# Patient Record
Sex: Female | Born: 1980 | Race: Black or African American | Hispanic: No | Marital: Single | Smoking: Current every day smoker
Health system: Southern US, Community
[De-identification: ages and names within clinical notes are randomized; demographics above are authoritative.]

## PROBLEM LIST (undated history)

## (undated) DIAGNOSIS — K2970 Gastritis, unspecified, without bleeding: Secondary | ICD-10-CM

## (undated) DIAGNOSIS — J45909 Unspecified asthma, uncomplicated: Secondary | ICD-10-CM

## (undated) HISTORY — PX: APPENDECTOMY: SHX54

---

## 2015-06-06 ENCOUNTER — Emergency Department (HOSPITAL_COMMUNITY)
Admission: EM | Admit: 2015-06-06 | Discharge: 2015-06-07 | Disposition: A | Discharge status: Home / Self Care | Payer: Self-pay | Attending: Emergency Medicine | Admitting: Emergency Medicine

## 2015-06-06 ENCOUNTER — Encounter (HOSPITAL_COMMUNITY): Payer: Self-pay | Admitting: Oncology

## 2015-06-06 DIAGNOSIS — F17200 Nicotine dependence, unspecified, uncomplicated: Secondary | ICD-10-CM | POA: Insufficient documentation

## 2015-06-06 DIAGNOSIS — R112 Nausea with vomiting, unspecified: Secondary | ICD-10-CM | POA: Insufficient documentation

## 2015-06-06 DIAGNOSIS — J45909 Unspecified asthma, uncomplicated: Secondary | ICD-10-CM | POA: Insufficient documentation

## 2015-06-06 DIAGNOSIS — R109 Unspecified abdominal pain: Secondary | ICD-10-CM | POA: Insufficient documentation

## 2015-06-06 DIAGNOSIS — R197 Diarrhea, unspecified: Secondary | ICD-10-CM | POA: Insufficient documentation

## 2015-06-06 HISTORY — DX: Unspecified asthma, uncomplicated: J45.909

## 2015-06-06 LAB — I-STAT BETA HCG BLOOD, ED (MC, WL, AP ONLY)

## 2015-06-06 LAB — COMPREHENSIVE METABOLIC PANEL
ALBUMIN: 4.2 g/dL (ref 3.5–5.0)
ALT: 9 U/L — ABNORMAL LOW (ref 14–54)
ANION GAP: 11 (ref 5–15)
AST: 17 U/L (ref 15–41)
Alkaline Phosphatase: 63 U/L (ref 38–126)
BILIRUBIN TOTAL: 0.7 mg/dL (ref 0.3–1.2)
BUN: 7 mg/dL (ref 6–20)
CALCIUM: 9.9 mg/dL (ref 8.9–10.3)
CO2: 22 mmol/L (ref 22–32)
Chloride: 102 mmol/L (ref 101–111)
Creatinine, Ser: 0.43 mg/dL — ABNORMAL LOW (ref 0.44–1.00)
GLUCOSE: 101 mg/dL — AB (ref 65–99)
POTASSIUM: 4.2 mmol/L (ref 3.5–5.1)
Sodium: 135 mmol/L (ref 135–145)
TOTAL PROTEIN: 7.5 g/dL (ref 6.5–8.1)

## 2015-06-06 LAB — CBC
HEMATOCRIT: 39 % (ref 36.0–46.0)
HEMOGLOBIN: 13.3 g/dL (ref 12.0–15.0)
MCH: 31.4 pg (ref 26.0–34.0)
MCHC: 34.1 g/dL (ref 30.0–36.0)
MCV: 92.2 fL (ref 78.0–100.0)
Platelets: 406 10*3/uL — ABNORMAL HIGH (ref 150–400)
RBC: 4.23 MIL/uL (ref 3.87–5.11)
RDW: 12.9 % (ref 11.5–15.5)
WBC: 12.6 10*3/uL — AB (ref 4.0–10.5)

## 2015-06-06 LAB — LIPASE, BLOOD: Lipase: 25 U/L (ref 11–51)

## 2015-06-06 NOTE — ED Notes (Signed)
Called pt name in lobby for blood draw with no response.  RN sts she might be in restroom, will try back later.

## 2015-06-06 NOTE — ED Notes (Signed)
Pt went to dinner tonight and is now experiencing severe abdominal pain, nausea/vomiting and diarrhea.  Pt rates pain 10/10 sharp in nature.

## 2016-05-09 ENCOUNTER — Emergency Department (HOSPITAL_COMMUNITY)
Admission: EM | Admit: 2016-05-09 | Discharge: 2016-05-09 | Disposition: A | Discharge status: Home / Self Care | Payer: Medicaid Other | Attending: Emergency Medicine | Admitting: Emergency Medicine

## 2016-05-09 ENCOUNTER — Emergency Department (HOSPITAL_COMMUNITY): Payer: Medicaid Other

## 2016-05-09 ENCOUNTER — Encounter (HOSPITAL_COMMUNITY): Payer: Self-pay

## 2016-05-09 DIAGNOSIS — F17200 Nicotine dependence, unspecified, uncomplicated: Secondary | ICD-10-CM | POA: Diagnosis not present

## 2016-05-09 DIAGNOSIS — R1013 Epigastric pain: Secondary | ICD-10-CM | POA: Insufficient documentation

## 2016-05-09 DIAGNOSIS — J45909 Unspecified asthma, uncomplicated: Secondary | ICD-10-CM | POA: Insufficient documentation

## 2016-05-09 LAB — COMPREHENSIVE METABOLIC PANEL
ALK PHOS: 58 U/L (ref 38–126)
ALT: 9 U/L — AB (ref 14–54)
AST: 18 U/L (ref 15–41)
Albumin: 3.8 g/dL (ref 3.5–5.0)
Anion gap: 9 (ref 5–15)
BUN: 6 mg/dL (ref 6–20)
CALCIUM: 8.7 mg/dL — AB (ref 8.9–10.3)
CO2: 22 mmol/L (ref 22–32)
CREATININE: 0.61 mg/dL (ref 0.44–1.00)
Chloride: 104 mmol/L (ref 101–111)
Glucose, Bld: 96 mg/dL (ref 65–99)
Potassium: 3.9 mmol/L (ref 3.5–5.1)
Sodium: 135 mmol/L (ref 135–145)
TOTAL PROTEIN: 6.7 g/dL (ref 6.5–8.1)
Total Bilirubin: 0.2 mg/dL — ABNORMAL LOW (ref 0.3–1.2)

## 2016-05-09 LAB — URINALYSIS, ROUTINE W REFLEX MICROSCOPIC
Bilirubin Urine: NEGATIVE
Glucose, UA: NEGATIVE mg/dL
Hgb urine dipstick: NEGATIVE
KETONES UR: 5 mg/dL — AB
LEUKOCYTES UA: NEGATIVE
Nitrite: NEGATIVE
PROTEIN: NEGATIVE mg/dL
Specific Gravity, Urine: 1.021 (ref 1.005–1.030)
pH: 5 (ref 5.0–8.0)

## 2016-05-09 LAB — CBC
HCT: 34.6 % — ABNORMAL LOW (ref 36.0–46.0)
Hemoglobin: 11.9 g/dL — ABNORMAL LOW (ref 12.0–15.0)
MCH: 31.2 pg (ref 26.0–34.0)
MCHC: 34.4 g/dL (ref 30.0–36.0)
MCV: 90.6 fL (ref 78.0–100.0)
PLATELETS: 389 10*3/uL (ref 150–400)
RBC: 3.82 MIL/uL — AB (ref 3.87–5.11)
RDW: 14 % (ref 11.5–15.5)
WBC: 11.8 10*3/uL — AB (ref 4.0–10.5)

## 2016-05-09 LAB — I-STAT BETA HCG BLOOD, ED (MC, WL, AP ONLY)

## 2016-05-09 LAB — LIPASE, BLOOD: LIPASE: 23 U/L (ref 11–51)

## 2016-05-09 MED ORDER — ONDANSETRON HCL 4 MG/2ML IJ SOLN
4.0000 mg | Freq: Once | INTRAMUSCULAR | Status: AC
  Administered 2016-05-09: 4 mg via INTRAVENOUS
  Filled 2016-05-09: qty 2

## 2016-05-09 MED ORDER — ONDANSETRON 4 MG PO TBDP: ORAL_TABLET | ORAL | 0 refills | Status: DC

## 2016-05-09 MED ORDER — DICYCLOMINE HCL 20 MG PO TABS: 20.0000 mg | ORAL_TABLET | Freq: Two times a day (BID) | ORAL | 0 refills | Status: DC

## 2016-05-09 MED ORDER — PANTOPRAZOLE SODIUM 40 MG IV SOLR
40.0000 mg | Freq: Once | INTRAVENOUS | Status: AC
  Administered 2016-05-09: 40 mg via INTRAVENOUS
  Filled 2016-05-09: qty 40

## 2016-05-09 MED ORDER — PANTOPRAZOLE SODIUM 20 MG PO TBEC: 20.0000 mg | DELAYED_RELEASE_TABLET | Freq: Every day | ORAL | 0 refills | Status: DC

## 2016-05-09 MED ORDER — HYDROCODONE-ACETAMINOPHEN 5-325 MG PO TABS: 1.0000 | ORAL_TABLET | Freq: Four times a day (QID) | ORAL | 0 refills | Status: DC | PRN

## 2016-05-09 MED ORDER — HYDROMORPHONE HCL 1 MG/ML IJ SOLN
1.0000 mg | Freq: Once | INTRAMUSCULAR | Status: AC
  Administered 2016-05-09: 1 mg via INTRAVENOUS
  Filled 2016-05-09: qty 1

## 2016-05-09 NOTE — ED Notes (Signed)
ED Provider at bedside. 

## 2016-05-09 NOTE — Discharge Instructions (Signed)
Follow-up with your doctor when he gets back home

## 2016-05-09 NOTE — ED Triage Notes (Signed)
Pt states that she has a hx of gastritis and is experiencing epigastric abdominal pain. Pt states that she has vomited clear liquid and is unable to keep anything down. A&Ox4.

## 2016-05-09 NOTE — ED Provider Notes (Signed)
[facility]-EMERGENCY DEPT Provider Note   CSN: [id number] Arrival date & time: 05/09/16  1329     History   Chief Complaint Chief Complaint  Patient presents with  . Abdominal Pain    HPI Shirley Sartoriuslicia Mckinney is a 36 y.o. female.  Patient complains of abdominal pain. Patient has a history of gastritis and usually takes Pepcid and Bentyl. She is out of her Pepcid   The history is provided by the patient. No language interpreter was used.  Abdominal Pain   This is a recurrent problem. The current episode started 12 to 24 hours ago. The problem occurs constantly. The problem has not changed since onset.The pain is associated with an unknown factor. The pain is located in the epigastric region. The pain is at a severity of 5/10. The pain is moderate. Pertinent negatives include anorexia, diarrhea, frequency, hematuria and headaches.    Past Medical History:  Diagnosis Date  . Asthma     There are no active problems to display for this patient.   Past Surgical History:  Procedure Laterality Date  . APPENDECTOMY      OB History    No data available       Home Medications    Prior to Admission medications   Medication Sig Start Date End Date Taking? Authorizing Provider  famotidine (PEPCID) 20 MG tablet Take 20 mg by mouth at bedtime. 03/07/16  Yes Historical Provider, MD  dicyclomine (BENTYL) 20 MG tablet Take 1 tablet (20 mg total) by mouth 2 (two) times daily. 05/09/16   Bethann BerkshireJoseph Claudy Abdallah, MD  HYDROcodone-acetaminophen (NORCO/VICODIN) 5-325 MG tablet Take 1 tablet by mouth every 6 (six) hours as needed for moderate pain. 05/09/16   Bethann BerkshireJoseph Honour Schwieger, MD  ondansetron (ZOFRAN ODT) 4 MG disintegrating tablet 4mg  ODT q4 hours prn nausea/vomit 05/09/16   Bethann BerkshireJoseph Harper Vandervoort, MD  pantoprazole (PROTONIX) 20 MG tablet Take 1 tablet (20 mg total) by mouth daily. 05/09/16   Bethann BerkshireJoseph Mackayla Mullins, MD    Family History History reviewed. No pertinent family history.  Social History Social History  Substance  Use Topics  . Smoking status: Current Every Day Smoker  . Smokeless tobacco: Never Used  . Alcohol use Yes     Allergies   Ibuprofen; Penicillins; Tramadol; and Asa [aspirin]   Review of Systems Review of Systems  Constitutional: Negative for appetite change and fatigue.  HENT: Negative for congestion, ear discharge and sinus pressure.   Eyes: Negative for discharge.  Respiratory: Negative for cough.   Cardiovascular: Negative for chest pain.  Gastrointestinal: Positive for abdominal pain. Negative for anorexia and diarrhea.  Genitourinary: Negative for frequency and hematuria.  Musculoskeletal: Negative for back pain.  Skin: Negative for rash.  Neurological: Negative for seizures and headaches.  Psychiatric/Behavioral: Negative for hallucinations.     Physical Exam Updated Vital Signs BP 108/75 (BP Location: Right Arm)   Pulse 69   Temp 98 F (36.7 C) (Oral)   Resp 16   Ht 5\' 2"  (1.575 m)   Wt 175 lb (79.4 kg)   SpO2 100%   BMI 32.01 kg/m   Physical Exam  Constitutional: She is oriented to person, place, and time. She appears well-developed.  HENT:  Head: Normocephalic.  Eyes: Conjunctivae and EOM are normal. No scleral icterus.  Neck: Neck supple. No thyromegaly present.  Cardiovascular: Normal rate and regular rhythm.  Exam reveals no gallop and no friction rub.   No murmur heard. Pulmonary/Chest: No stridor. She has no wheezes. She has no rales. She  exhibits no tenderness.  Abdominal: She exhibits no distension. There is tenderness. There is no rebound.  Tender epigastric moderate  Musculoskeletal: Normal range of motion. She exhibits no edema.  Lymphadenopathy:    She has no cervical adenopathy.  Neurological: She is oriented to person, place, and time. She exhibits normal muscle tone. Coordination normal.  Skin: No rash noted. No erythema.  Psychiatric: She has a normal mood and affect. Her behavior is normal.     ED Treatments / Results  Labs (all  labs ordered are listed, but only abnormal results are displayed) Labs Reviewed  COMPREHENSIVE METABOLIC PANEL - Abnormal; Notable for the following:       Result Value   Calcium 8.7 (*)    ALT 9 (*)    Total Bilirubin 0.2 (*)    All other components within normal limits  CBC - Abnormal; Notable for the following:    WBC 11.8 (*)    RBC 3.82 (*)    Hemoglobin 11.9 (*)    HCT 34.6 (*)    All other components within normal limits  URINALYSIS, ROUTINE W REFLEX MICROSCOPIC - Abnormal; Notable for the following:    Ketones, ur 5 (*)    All other components within normal limits  LIPASE, BLOOD  I-STAT BETA HCG BLOOD, ED ([facility], [facility], [facility] ONLY)    EKG  EKG Interpretation None       Radiology Dg Abd Acute W/chest  Result Date: 05/09/2016 CLINICAL DATA:  Pt c/o n/v epigastric pain x 2 days, states she saw an MD in her home state of [place] yesterday who said she had gastritis. Pt also c/o loss of appetite x 3 days. EXAM: DG ABDOMEN ACUTE W/ 1V CHEST COMPARISON:  None. FINDINGS: On chest radiography there is some indistinct hazy density at the right lung base. Heart size within normal limits. There several air- fluid levels in the right colon and possibly in central loops of nondilated small bowel. Otherwise normal bowel gas pattern. No significant abnormal calcifications are identified. Transitional L5 vertebra. IMPRESSION: 1. Faint hazy density at the right lung base could represent early or incipient pneumonia, correlate with breath sounds. 2. There several air-fluid levels in the ascending colon and possibly within right-sided nondilated loops of small bowel, but overall this appearance is not considered overtly abnormal and there is no dilated bowel. Electronically Signed   By: Gaylyn Rong M.D.   On: 05/09/2016 16:14    Procedures Procedures (including critical care time)  Medications Ordered in ED Medications  pantoprazole (PROTONIX) injection 40 mg (40 mg Intravenous Given  05/09/16 1508)  ondansetron (ZOFRAN) injection 4 mg (4 mg Intravenous Given 05/09/16 1508)  HYDROmorphone (DILAUDID) injection 1 mg (1 mg Intravenous Given 05/09/16 1508)  HYDROmorphone (DILAUDID) injection 1 mg (1 mg Intravenous Given 05/09/16 1614)     Initial Impression / Assessment and Plan / ED Course  I have reviewed the triage vital signs and the nursing notes.  Pertinent labs & imaging results that were available during my care of the patient were reviewed by me and considered in my medical decision making (see chart for details).     Labs unremarkable. Abdominal series unremarkable. Patient is sent home with Bentyl, protonic. Vicodin.   Pt to follow up with her md  Final Clinical Impressions(s) / ED Diagnoses   Final diagnoses:  Epigastric pain    New Prescriptions New Prescriptions   DICYCLOMINE (BENTYL) 20 MG TABLET    Take 1 tablet (20 mg total)  by mouth 2 (two) times daily.   HYDROCODONE-ACETAMINOPHEN (NORCO/VICODIN) 5-325 MG TABLET    Take 1 tablet by mouth every 6 (six) hours as needed for moderate pain.   ONDANSETRON (ZOFRAN ODT) 4 MG DISINTEGRATING TABLET    4mg  ODT q4 hours prn nausea/vomit   PANTOPRAZOLE (PROTONIX) 20 MG TABLET    Take 1 tablet (20 mg total) by mouth daily.     Bethann Berkshire, MD 05/09/16 [phone]

## 2016-05-09 NOTE — ED Notes (Signed)
Patient transported to X-ray 

## 2016-10-30 ENCOUNTER — Encounter (HOSPITAL_COMMUNITY): Payer: Self-pay | Admitting: Emergency Medicine

## 2016-10-30 ENCOUNTER — Emergency Department (HOSPITAL_COMMUNITY)
Admission: EM | Admit: 2016-10-30 | Discharge: 2016-10-30 | Disposition: A | Discharge status: Home / Self Care | Payer: Medicaid - Out of State | Attending: Emergency Medicine | Admitting: Emergency Medicine

## 2016-10-30 DIAGNOSIS — R110 Nausea: Secondary | ICD-10-CM | POA: Insufficient documentation

## 2016-10-30 DIAGNOSIS — K2920 Alcoholic gastritis without bleeding: Secondary | ICD-10-CM | POA: Diagnosis not present

## 2016-10-30 DIAGNOSIS — R109 Unspecified abdominal pain: Secondary | ICD-10-CM | POA: Diagnosis present

## 2016-10-30 DIAGNOSIS — Z79899 Other long term (current) drug therapy: Secondary | ICD-10-CM | POA: Diagnosis not present

## 2016-10-30 DIAGNOSIS — F17200 Nicotine dependence, unspecified, uncomplicated: Secondary | ICD-10-CM | POA: Insufficient documentation

## 2016-10-30 LAB — COMPREHENSIVE METABOLIC PANEL
ALBUMIN: 3.8 g/dL (ref 3.5–5.0)
ALT: 9 U/L — ABNORMAL LOW (ref 14–54)
ANION GAP: 9 (ref 5–15)
AST: 21 U/L (ref 15–41)
Alkaline Phosphatase: 76 U/L (ref 38–126)
BUN: 6 mg/dL (ref 6–20)
CHLORIDE: 102 mmol/L (ref 101–111)
CO2: 25 mmol/L (ref 22–32)
Calcium: 8.8 mg/dL — ABNORMAL LOW (ref 8.9–10.3)
Creatinine, Ser: 0.71 mg/dL (ref 0.44–1.00)
GFR calc Af Amer: >60 mL/min (ref >60)
GFR calc non Af Amer: >60 mL/min (ref >60)
GLUCOSE: 101 mg/dL — AB (ref 65–99)
POTASSIUM: 3.7 mmol/L (ref 3.5–5.1)
SODIUM: 136 mmol/L (ref 135–145)
Total Bilirubin: 0.5 mg/dL (ref 0.3–1.2)
Total Protein: 6.6 g/dL (ref 6.5–8.1)

## 2016-10-30 LAB — URINALYSIS, ROUTINE W REFLEX MICROSCOPIC
BACTERIA UA: NONE SEEN
BILIRUBIN URINE: NEGATIVE
Glucose, UA: NEGATIVE mg/dL
Hgb urine dipstick: NEGATIVE
Ketones, ur: NEGATIVE mg/dL
LEUKOCYTES UA: NEGATIVE
Nitrite: NEGATIVE
Protein, ur: 30 mg/dL — AB
SPECIFIC GRAVITY, URINE: 1.028 (ref 1.005–1.030)
pH: 7 (ref 5.0–8.0)

## 2016-10-30 LAB — CBC
HEMATOCRIT: 36.9 % (ref 36.0–46.0)
Hemoglobin: 12.7 g/dL (ref 12.0–15.0)
MCH: 31.1 pg (ref 26.0–34.0)
MCHC: 34.4 g/dL (ref 30.0–36.0)
MCV: 90.2 fL (ref 78.0–100.0)
Platelets: 381 10*3/uL (ref 150–400)
RBC: 4.09 MIL/uL (ref 3.87–5.11)
RDW: 13.7 % (ref 11.5–15.5)
WBC: 16.7 10*3/uL — ABNORMAL HIGH (ref 4.0–10.5)

## 2016-10-30 LAB — I-STAT BETA HCG BLOOD, ED (MC, WL, AP ONLY)

## 2016-10-30 LAB — LIPASE, BLOOD: LIPASE: 34 U/L (ref 11–51)

## 2016-10-30 LAB — PREGNANCY, URINE: Preg Test, Ur: NEGATIVE

## 2016-10-30 MED ORDER — MORPHINE SULFATE (PF) 4 MG/ML IV SOLN
4.0000 mg | Freq: Once | INTRAVENOUS | Status: AC
  Administered 2016-10-30: 4 mg via INTRAVENOUS
  Filled 2016-10-30: qty 1

## 2016-10-30 MED ORDER — ONDANSETRON HCL 4 MG PO TABS: 4.0000 mg | ORAL_TABLET | Freq: Three times a day (TID) | ORAL | 0 refills | Status: DC | PRN

## 2016-10-30 MED ORDER — PANTOPRAZOLE SODIUM 20 MG PO TBEC: 20.0000 mg | DELAYED_RELEASE_TABLET | Freq: Every day | ORAL | 0 refills | Status: DC

## 2016-10-30 MED ORDER — DICYCLOMINE HCL 10 MG/ML IM SOLN
20.0000 mg | Freq: Once | INTRAMUSCULAR | Status: DC
  Filled 2016-10-30: qty 2

## 2016-10-30 MED ORDER — ONDANSETRON HCL 4 MG/2ML IJ SOLN
4.0000 mg | Freq: Once | INTRAMUSCULAR | Status: AC
  Administered 2016-10-30: 4 mg via INTRAVENOUS
  Filled 2016-10-30: qty 2

## 2016-10-30 MED ORDER — SODIUM CHLORIDE 0.9 % IV BOLUS (SEPSIS)
500.0000 mL | Freq: Once | INTRAVENOUS | Status: AC
  Administered 2016-10-30: 500 mL via INTRAVENOUS

## 2016-10-30 MED ORDER — GI COCKTAIL ~~LOC~~: 30.0000 mL | Freq: Two times a day (BID) | ORAL | 0 refills | Status: DC | PRN

## 2016-10-30 MED ORDER — PANTOPRAZOLE SODIUM 40 MG IV SOLR
40.0000 mg | Freq: Once | INTRAVENOUS | Status: AC
  Administered 2016-10-30: 40 mg via INTRAVENOUS
  Filled 2016-10-30: qty 40

## 2016-10-30 MED ORDER — HYDROCODONE-ACETAMINOPHEN 5-325 MG PO TABS: 1.0000 | ORAL_TABLET | ORAL | 0 refills | Status: DC | PRN

## 2016-10-30 MED ORDER — GI COCKTAIL ~~LOC~~
30.0000 mL | Freq: Once | ORAL | Status: AC
  Administered 2016-10-30: 30 mL via ORAL
  Filled 2016-10-30: qty 30

## 2016-10-30 NOTE — ED Triage Notes (Signed)
Pt c/o abdominal pain with nausea onset today. Pt has history of gastritis and reports pain is similar. Pt LMP was 2 days ago but only spotting. Pt denies vaginal discharge or painful urination.

## 2016-10-30 NOTE — ED Provider Notes (Signed)
[facility]-EMERGENCY DEPT Provider Note   CSN: [id number] Arrival date & time: 10/30/16  1730     History   Chief Complaint Chief Complaint  Patient presents with  . Abdominal Pain  . Nausea    HPI Shirley Mckinney is a 36 y.o. female.  HPI   Pt with hx asthma, gastritis, p/w central abdominal pain and nausea that began at 8am today that feels like prior gastritis.  States she drank liquor last night for the first time in 6 months, this is what typically sets off her gastritis.  Pain is improved with pushing down on her abdomen. Denies vomiting, bowel or urinary symptoms.  No menstrual period since May 29, did have spotting two days ago.  Abdominal surgical history including 3 c-sections, appendectomy.    Past Medical History:  Diagnosis Date  . Asthma     There are no active problems to display for this patient.   Past Surgical History:  Procedure Laterality Date  . APPENDECTOMY      OB History    No data available       Home Medications    Prior to Admission medications   Medication Sig Start Date End Date Taking? Authorizing Provider  Alum & Mag Hydroxide-Simeth (GI COCKTAIL) SUSP suspension Take 30 mLs by mouth 2 (two) times daily as needed for indigestion (gastritis pain). Shake well. 10/30/16   Trixie DredgeWest, Tyera Hansley, PA-C  dicyclomine (BENTYL) 20 MG tablet Take 1 tablet (20 mg total) by mouth 2 (two) times daily. 05/09/16   Bethann BerkshireZammit, Joseph, MD  famotidine (PEPCID) 20 MG tablet Take 20 mg by mouth at bedtime. 03/07/16   [provider]  ondansetron (ZOFRAN) 4 MG tablet Take 1 tablet (4 mg total) by mouth every 8 (eight) hours as needed for nausea or vomiting. 10/30/16   Trixie DredgeWest, Jordy Hewins, PA-C  pantoprazole (PROTONIX) 20 MG tablet Take 1 tablet (20 mg total) by mouth daily. 05/09/16   Bethann BerkshireZammit, Joseph, MD  pantoprazole (PROTONIX) 20 MG tablet Take 1 tablet (20 mg total) by mouth daily. 10/30/16   Trixie DredgeWest, Feleshia Zundel, PA-C    Family History No family history on file.  Social  History Social History  Substance Use Topics  . Smoking status: Current Every Day Smoker  . Smokeless tobacco: Never Used  . Alcohol use Yes     Allergies   Ibuprofen; Penicillins; Tramadol; and Asa [aspirin]   Review of Systems Review of Systems  All other systems reviewed and are negative.    Physical Exam Updated Vital Signs BP 112/77   Pulse 79   Temp 98.3 F (36.8 C) (Oral)   Resp 20   Ht 5\' 2"  (1.575 m)   Wt 78.9 kg (174 lb)   LMP 10/28/2016 Comment: spotting 2 days ago  SpO2 100%   BMI 31.83 kg/m   Physical Exam  Constitutional: She appears well-developed and well-nourished. No distress.  HENT:  Head: Normocephalic and atraumatic.  Neck: Neck supple.  Cardiovascular: Normal rate and regular rhythm.   Pulmonary/Chest: Effort normal and breath sounds normal. No respiratory distress. She has no wheezes. She has no rales.  Abdominal: Soft. She exhibits no distension and no mass. There is no tenderness. There is no rebound and no guarding.  Neurological: She is alert.  Skin: She is not diaphoretic.  Nursing note and vitals reviewed.    ED Treatments / Results  Labs (all labs ordered are listed, but only abnormal results are displayed) Labs Reviewed  COMPREHENSIVE METABOLIC PANEL - Abnormal; Notable for  the following:       Result Value   Glucose, Bld 101 (*)    Calcium 8.8 (*)    ALT 9 (*)    All other components within normal limits  CBC - Abnormal; Notable for the following:    WBC 16.7 (*)    All other components within normal limits  URINALYSIS, ROUTINE W REFLEX MICROSCOPIC - Abnormal; Notable for the following:    APPearance HAZY (*)    Protein, ur 30 (*)    Squamous Epithelial / LPF 0-5 (*)    All other components within normal limits  LIPASE, BLOOD  PREGNANCY, URINE  I-STAT BETA HCG BLOOD, ED ([facility], [facility], [facility] ONLY)    EKG  EKG Interpretation None       Radiology No results found.  Procedures Procedures (including critical care  time)  Medications Ordered in ED Medications  dicyclomine (BENTYL) injection 20 mg (20 mg Intramuscular Refused 10/30/16 1913)  pantoprazole (PROTONIX) injection 40 mg (40 mg Intravenous Given 10/30/16 1847)  gi cocktail (Maalox,Lidocaine,Donnatal) (30 mLs Oral Given 10/30/16 1844)  ondansetron (ZOFRAN) injection 4 mg (4 mg Intravenous Given 10/30/16 1846)  sodium chloride 0.9 % bolus 500 mL (0 mLs Intravenous Stopped 10/30/16 1952)  morphine 4 MG/ML injection 4 mg (4 mg Intravenous Given 10/30/16 1919)     Initial Impression / Assessment and Plan / ED Course  I have reviewed the triage vital signs and the nursing notes.  Pertinent labs & imaging results that were available during my care of the patient were reviewed by me and considered in my medical decision making (see chart for details).  Clinical Course as of Oct 31 2010  Sat Oct 30, 2016  1937 Reexamination of the abdomen remains benign.  Mild epigastric tenderness.  No guarding or rebound.  Pt reports she is feeling better.  Tolerating PO.  States she just moved from [place] at the first of this month.  Needs resources.    [EW]    Clinical Course User Index [EW] Chad, Gwendlyn Hanback, [place]    Afebrile, nontoxic patient with recurrent gastritis.  Pt d/c with symptomatic treatment - pt complained to nurse that she wanted narcotic pain medication for home but left prior to any discussion or printing of narcotic prescription.  This was then cancelled.  Labs reassuring.  Abdominal exam is benign.  Pt advised to abstain from alcohol and cigarettes.   D/C home with PCP, GI follow up, medications for gastritis.  Discussed result, findings, treatment, and follow up  with patient.  Pt given return precautions.  Pt verbalizes understanding and agrees with plan.       Final Clinical Impressions(s) / ED Diagnoses   Final diagnoses:  Acute alcoholic gastritis, presence of bleeding unspecified    New Prescriptions Discharge Medication List as of  10/30/2016  8:04 PM    START taking these medications   Details  Alum & Mag Hydroxide-Simeth (GI COCKTAIL) SUSP suspension Take 30 mLs by mouth 2 (two) times daily as needed for indigestion (gastritis pain). Shake well., Starting Sat 10/30/2016, Print    ondansetron (ZOFRAN) 4 MG tablet Take 1 tablet (4 mg total) by mouth every 8 (eight) hours as needed for nausea or vomiting., Starting Sat 10/30/2016, Print    !! pantoprazole (PROTONIX) 20 MG tablet Take 1 tablet (20 mg total) by mouth daily., Starting Sat 10/30/2016, Print     !! - Potential duplicate medications found. Please discuss with provider.       Chad,  Mickie Hillier 10/30/16 2012    Jerelyn Scott, MD 10/30/16 2056

## 2016-10-30 NOTE — Discharge Instructions (Signed)
Read the information below.  Use the prescribed medication as directed.  Please discuss all new medications with your pharmacist.  You may return to the Emergency Department at any time for worsening condition or any new symptoms that concern you.     If you develop high fevers, worsening abdominal pain, uncontrolled vomiting, or are unable to tolerate fluids by mouth, return to the ER for a recheck.    You have been given a prescription of narcotic medication for your severe pain.  Do not use this to cover up any worsening condition.  It is important that you take the other prescribed medication as recommended and follow up with your primary care provider and the referred gastroenterologist.

## 2016-10-30 NOTE — ED Notes (Signed)
Pt given sprite and graham crackers for PO challenge 

## 2016-10-30 NOTE — ED Notes (Signed)
Pt requesting prescription to address pain. EDP made aware.

## 2016-11-03 ENCOUNTER — Encounter (HOSPITAL_COMMUNITY): Payer: Self-pay | Admitting: Emergency Medicine

## 2016-11-03 ENCOUNTER — Emergency Department (HOSPITAL_COMMUNITY)
Admission: EM | Admit: 2016-11-03 | Discharge: 2016-11-03 | Disposition: A | Discharge status: Home / Self Care | Payer: Medicaid - Out of State | Attending: Emergency Medicine | Admitting: Emergency Medicine

## 2016-11-03 ENCOUNTER — Emergency Department (HOSPITAL_COMMUNITY)
Admission: EM | Admit: 2016-11-03 | Discharge: 2016-11-03 | Disposition: A | Discharge status: Home / Self Care | Payer: Medicaid Other | Attending: Emergency Medicine | Admitting: Emergency Medicine

## 2016-11-03 DIAGNOSIS — F17200 Nicotine dependence, unspecified, uncomplicated: Secondary | ICD-10-CM | POA: Diagnosis not present

## 2016-11-03 DIAGNOSIS — Z79899 Other long term (current) drug therapy: Secondary | ICD-10-CM | POA: Insufficient documentation

## 2016-11-03 DIAGNOSIS — J45909 Unspecified asthma, uncomplicated: Secondary | ICD-10-CM | POA: Insufficient documentation

## 2016-11-03 DIAGNOSIS — Z5321 Procedure and treatment not carried out due to patient leaving prior to being seen by health care provider: Secondary | ICD-10-CM | POA: Insufficient documentation

## 2016-11-03 DIAGNOSIS — R109 Unspecified abdominal pain: Secondary | ICD-10-CM | POA: Insufficient documentation

## 2016-11-03 DIAGNOSIS — J4521 Mild intermittent asthma with (acute) exacerbation: Secondary | ICD-10-CM | POA: Insufficient documentation

## 2016-11-03 DIAGNOSIS — K2900 Acute gastritis without bleeding: Secondary | ICD-10-CM | POA: Insufficient documentation

## 2016-11-03 DIAGNOSIS — R1012 Left upper quadrant pain: Secondary | ICD-10-CM | POA: Diagnosis present

## 2016-11-03 HISTORY — DX: Gastritis, unspecified, without bleeding: K29.70

## 2016-11-03 LAB — CBC
HEMATOCRIT: 35.4 % — AB (ref 36.0–46.0)
HEMOGLOBIN: 12 g/dL (ref 12.0–15.0)
MCH: 30.6 pg (ref 26.0–34.0)
MCHC: 33.9 g/dL (ref 30.0–36.0)
MCV: 90.3 fL (ref 78.0–100.0)
Platelets: 379 10*3/uL (ref 150–400)
RBC: 3.92 MIL/uL (ref 3.87–5.11)
RDW: 13.8 % (ref 11.5–15.5)
WBC: 10.8 10*3/uL — AB (ref 4.0–10.5)

## 2016-11-03 LAB — COMPREHENSIVE METABOLIC PANEL
ALBUMIN: 3.6 g/dL (ref 3.5–5.0)
ALK PHOS: 75 U/L (ref 38–126)
ALT: 8 U/L — ABNORMAL LOW (ref 14–54)
ANION GAP: 10 (ref 5–15)
AST: 15 U/L (ref 15–41)
BILIRUBIN TOTAL: 0.4 mg/dL (ref 0.3–1.2)
BUN: 10 mg/dL (ref 6–20)
CALCIUM: 8.9 mg/dL (ref 8.9–10.3)
CO2: 20 mmol/L — ABNORMAL LOW (ref 22–32)
Chloride: 105 mmol/L (ref 101–111)
Creatinine, Ser: 0.68 mg/dL (ref 0.44–1.00)
GLUCOSE: 97 mg/dL (ref 65–99)
POTASSIUM: 4.4 mmol/L (ref 3.5–5.1)
Sodium: 135 mmol/L (ref 135–145)
TOTAL PROTEIN: 6.3 g/dL — AB (ref 6.5–8.1)

## 2016-11-03 LAB — URINALYSIS, ROUTINE W REFLEX MICROSCOPIC
BILIRUBIN URINE: NEGATIVE
Glucose, UA: NEGATIVE mg/dL
Hgb urine dipstick: NEGATIVE
Ketones, ur: NEGATIVE mg/dL
LEUKOCYTES UA: NEGATIVE
NITRITE: NEGATIVE
PH: 6 (ref 5.0–8.0)
Protein, ur: NEGATIVE mg/dL
SPECIFIC GRAVITY, URINE: 1.027 (ref 1.005–1.030)

## 2016-11-03 LAB — LIPASE, BLOOD: Lipase: 47 U/L (ref 11–51)

## 2016-11-03 MED ORDER — IPRATROPIUM-ALBUTEROL 0.5-2.5 (3) MG/3ML IN SOLN
3.0000 mL | Freq: Once | RESPIRATORY_TRACT | Status: AC
  Administered 2016-11-03: 3 mL via RESPIRATORY_TRACT
  Filled 2016-11-03: qty 3

## 2016-11-03 MED ORDER — ONDANSETRON 4 MG PO TBDP
ORAL_TABLET | ORAL | Status: AC
  Filled 2016-11-03: qty 1

## 2016-11-03 MED ORDER — ACETAMINOPHEN 500 MG PO TABS
ORAL_TABLET | ORAL | Status: AC
  Administered 2016-11-03
  Filled 2016-11-03: qty 1

## 2016-11-03 MED ORDER — PANTOPRAZOLE SODIUM 40 MG PO TBEC
40.0000 mg | DELAYED_RELEASE_TABLET | Freq: Once | ORAL | Status: AC
  Administered 2016-11-03: 40 mg via ORAL
  Filled 2016-11-03: qty 1

## 2016-11-03 MED ORDER — GI COCKTAIL ~~LOC~~
30.0000 mL | Freq: Once | ORAL | Status: AC
  Administered 2016-11-03: 30 mL via ORAL
  Filled 2016-11-03: qty 30

## 2016-11-03 MED ORDER — PREDNISONE 20 MG PO TABS
60.0000 mg | ORAL_TABLET | Freq: Once | ORAL | Status: AC
  Administered 2016-11-03: 60 mg via ORAL
  Filled 2016-11-03: qty 3

## 2016-11-03 MED ORDER — ONDANSETRON 4 MG PO TBDP
4.0000 mg | ORAL_TABLET | Freq: Once | ORAL | Status: AC | PRN
  Administered 2016-11-03: 4 mg via ORAL

## 2016-11-03 MED ORDER — PREDNISONE 20 MG PO TABS: 60.0000 mg | ORAL_TABLET | Freq: Every day | ORAL | 0 refills | Status: AC

## 2016-11-03 MED ORDER — ONDANSETRON HCL 4 MG/2ML IJ SOLN
4.0000 mg | Freq: Once | INTRAMUSCULAR | Status: DC
  Filled 2016-11-03: qty 2

## 2016-11-03 NOTE — ED Notes (Signed)
Patient states she is having an asthma attack-diffuse inspiratory and expiratory wheezing all fields-duoneb being administered

## 2016-11-03 NOTE — Discharge Instructions (Signed)
Please read and follow all provided instructions.  Your diagnoses today include:  1. Acute gastritis without hemorrhage, unspecified gastritis type   2. Mild intermittent asthma with exacerbation     Tests performed today include: Vital signs. See below for your results today.   Medications prescribed:  Take as prescribed   Home care instructions:  Follow any educational materials contained in this packet.  Follow-up instructions: Please follow-up with your primary care provider for further evaluation of symptoms and treatment   Return instructions:  Please return to the Emergency Department if you do not get better, if you get worse, or new symptoms OR  - Fever (temperature greater than 101.49F)  - Bleeding that does not stop with holding pressure to the area    -Severe pain (please note that you may be more sore the day after your accident)  - Chest Pain  - Difficulty breathing  - Severe nausea or vomiting  - Inability to tolerate food and liquids  - Passing out  - Skin becoming red around your wounds  - Change in mental status (confusion or lethargy)  - New numbness or weakness    Please return if you have any other emergent concerns.  Additional Information:  Your vital signs today were: BP 99/81 (BP Location: Left Arm)    Pulse 88    Temp 98.5 F (36.9 C) (Oral)    Resp 20    Ht 5\' 2"  (1.575 m)    Wt 79.4 kg (175 lb)    LMP 10/28/2016 Comment: spotting 2 days ago   SpO2 100%    BMI 32.01 kg/m  If your blood pressure (BP) was elevated above 135/85 this visit, please have this repeated by your doctor within one month. ---------------

## 2016-11-03 NOTE — ED Triage Notes (Signed)
Pt from home with c/o 10/10 upper abdominal pain. Pt states it began today. Pt states she has history of gastritis. [facility] but left prior to seeing a doctor. Pt denies emesis

## 2016-11-03 NOTE — ED Provider Notes (Signed)
[facility]-EMERGENCY DEPT Provider Note   CSN: [id number] Arrival date & time: 11/03/16  0111     History   Chief Complaint Chief Complaint  Patient presents with  . Abdominal Pain    HPI Shirley Mckinney is a 36 y.o. female.  HPI  36 y.o. female with a hx of Gastritis, presents to the Emergency Department today due to LUQ abdominal pain PTA. Hx same with ED visit on 10-30-16. Thought 2/2 ETOH. States pain 10/10. Intermittent. Worse with PO intake. Pt left [facility] due to long wait times PTA. No N/V. No fevers. No CP. No headaches. No meds PTA. During stay in room, pt developed wheezing 2/2 asthma exacerbation. Hx same. Pt on inhaler at home. No cough/congestion. No Sick contacts. No other symptoms noted.   Past Medical History:  Diagnosis Date  . Asthma   . Gastritis     There are no active problems to display for this patient.   Past Surgical History:  Procedure Laterality Date  . APPENDECTOMY      OB History    No data available       Home Medications    Prior to Admission medications   Medication Sig Start Date End Date Taking? Authorizing Provider  albuterol (PROVENTIL HFA;VENTOLIN HFA) 108 (90 Base) MCG/ACT inhaler Inhale 1-2 puffs into the lungs every 6 (six) hours as needed for wheezing or shortness of breath.   Yes [provider]  albuterol (PROVENTIL) (2.5 MG/3ML) 0.083% nebulizer solution Take 2.5 mg by nebulization every 6 (six) hours as needed for wheezing or shortness of breath.   Yes [provider]  Alum & Mag Hydroxide-Simeth (GI COCKTAIL) SUSP suspension Take 30 mLs by mouth 2 (two) times daily as needed for indigestion (gastritis pain). Shake well. 10/30/16  Yes West, Emily, PA-C  famotidine (PEPCID) 20 MG tablet Take 20 mg by mouth at bedtime. 03/07/16  Yes [provider]  pantoprazole (PROTONIX) 20 MG tablet Take 1 tablet (20 mg total) by mouth daily. 10/30/16  Yes West, Emily, PA-C  dicyclomine (BENTYL) 20 MG tablet Take 1 tablet (20  mg total) by mouth 2 (two) times daily. Patient not taking: Reported on 11/03/2016 05/09/16   Bethann BerkshireZammit, Joseph, MD  ondansetron (ZOFRAN) 4 MG tablet Take 1 tablet (4 mg total) by mouth every 8 (eight) hours as needed for nausea or vomiting. Patient not taking: Reported on 11/03/2016 10/30/16   Trixie DredgeWest, Emily, PA-C  pantoprazole (PROTONIX) 20 MG tablet Take 1 tablet (20 mg total) by mouth daily. Patient not taking: Reported on 11/03/2016 05/09/16   Bethann BerkshireZammit, Joseph, MD    Family History No family history on file.  Social History Social History  Substance Use Topics  . Smoking status: Current Every Day Smoker  . Smokeless tobacco: Never Used  . Alcohol use No     Allergies   Ibuprofen; Penicillins; Tramadol; and Asa [aspirin]   Review of Systems Review of Systems ROS reviewed and all are negative for acute change except as noted in the HPI.  Physical Exam Updated Vital Signs BP 107/85 (BP Location: Left Arm)   Pulse 68   Temp 98.9 F (37.2 C) (Oral)   Resp 19   Ht 5\' 2"  (1.575 m)   Wt 79.4 kg (175 lb)   LMP 10/28/2016 Comment: spotting 2 days ago  SpO2 98%   BMI 32.01 kg/m   Physical Exam  Constitutional: She is oriented to person, place, and time. Vital signs are normal. She appears well-developed and well-nourished. No  distress.  HENT:  Head: Normocephalic and atraumatic.  Right Ear: Hearing, tympanic membrane, external ear and ear canal normal.  Left Ear: Hearing, tympanic membrane, external ear and ear canal normal.  Nose: Nose normal.  Mouth/Throat: Uvula is midline, oropharynx is clear and moist and mucous membranes are normal. No trismus in the jaw. No oropharyngeal exudate, posterior oropharyngeal erythema or tonsillar abscesses.  Eyes: Pupils are equal, round, and reactive to light. Conjunctivae and EOM are normal.  Neck: Normal range of motion. Neck supple. No tracheal deviation present.  Cardiovascular: Normal rate, regular rhythm, S1 normal, S2 normal, normal heart  sounds, intact distal pulses and normal pulses.   Pulmonary/Chest: Effort normal. No respiratory distress. She has no decreased breath sounds. She has wheezes in the left upper field. She has no rhonchi. She has no rales.  Abdominal: Soft. Normal appearance and bowel sounds are normal. There is tenderness in the left upper quadrant. There is no rigidity, no rebound, no guarding, no CVA tenderness, no tenderness at McBurney's point and negative Murphy's sign.  Abdomen soft.   Musculoskeletal: Normal range of motion.  Neurological: She is alert and oriented to person, place, and time.  Skin: Skin is warm and dry.  Psychiatric: She has a normal mood and affect. Her speech is normal and behavior is normal. Thought content normal.  Nursing note and vitals reviewed.    ED Treatments / Results  Labs (all labs ordered are listed, but only abnormal results are displayed) Labs Reviewed  URINALYSIS, ROUTINE W REFLEX MICROSCOPIC - Abnormal; Notable for the following:       Result Value   APPearance HAZY (*)    All other components within normal limits    EKG  EKG Interpretation None       Radiology No results found.  Procedures Procedures (including critical care time)  Medications Ordered in ED Medications  ondansetron (ZOFRAN) injection 4 mg (4 mg Intravenous Refused 11/03/16 0538)  ipratropium-albuterol (DUONEB) 0.5-2.5 (3) MG/3ML nebulizer solution 3 mL (3 mLs Nebulization Given 11/03/16 0501)  gi cocktail (Maalox,Lidocaine,Donnatal) (30 mLs Oral Given 11/03/16 0537)  pantoprazole (PROTONIX) EC tablet 40 mg (40 mg Oral Given 11/03/16 0538)  predniSONE (DELTASONE) tablet 60 mg (60 mg Oral Given 11/03/16 0538)     Initial Impression / Assessment and Plan / ED Course  I have reviewed the triage vital signs and the nursing notes.  Pertinent labs & imaging results that were available during my care of the patient were reviewed by me and considered in my medical decision making (see  chart for details).  Final Clinical Impressions(s) / ED Diagnoses  {I have reviewed and evaluated the relevant laboratory values.   {I have reviewed the relevant previous healthcare records.  {I obtained HPI from historian.   ED Course:  Assessment: Pt is a 36 y.o. female with a hx of Gastritis, presents to the Emergency Department today due to LUQ abdominal pain PTA. Hx same with ED visit on 10-30-16. Thought 2/2 ETOH. States pain 10/10. Intermittent. Worse with PO intake. Pt left [facility] due to long wait times PTA. No N/V. No fevers. No CP. No headaches. No meds PTA. During stay in room, pt developed wheezing 2/2 asthma exacerbation. Hx same. Pt on inhaler at home. No cough/congestion. No Sick contacts. On exam, pt in NAD. Nontoxic/nonseptic appearing. VSS. O2 saturations WNL on RA. Afebrile. Lungs with bilateral wheeze. Heart RRR. Abdomen mild TTP LUQ. Soft. [facility] and unremarkable. Given GI  cocktail, Protonix, Duoneb, Prednisone in ED. Plan is to DC home with reflux medication as well as steroids for asthma exacerbation. At time of discharge, Patient is in no acute distress. Vital Signs are stable. Patient is able to ambulate. Patient able to tolerate PO.   6:05 AM- Pt left ED without prescriptions. Per tech, pt demanded IV be taken out   Disposition/Plan:  DC Home Additional Verbal discharge instructions given and discussed with patient.  Pt Instructed to f/u with PCP in the next week for evaluation and treatment of symptoms. Return precautions given Pt acknowledges and agrees with plan  Supervising Physician Palumbo, April, MD  Final diagnoses:  Acute gastritis without hemorrhage, unspecified gastritis type  Mild intermittent asthma with exacerbation    New Prescriptions New Prescriptions   No medications on file       Audry Pili, Cordelia Poche 11/03/16 [id number]    Palumbo, April, MD 11/03/16 [phone]

## 2016-11-03 NOTE — ED Notes (Signed)
The pt told registration she did not want to wait any longer

## 2016-11-03 NOTE — ED Triage Notes (Signed)
Pt reports gen abd pain present X 4 days (seen here on 7/21 for same) dx with alcoholic gastritis. Pain 10/10, aching.

## 2016-11-04 ENCOUNTER — Encounter (HOSPITAL_COMMUNITY): Payer: Self-pay

## 2016-11-04 ENCOUNTER — Emergency Department (HOSPITAL_COMMUNITY)
Admission: EM | Admit: 2016-11-04 | Discharge: 2016-11-04 | Disposition: A | Discharge status: Home / Self Care | Payer: Medicaid - Out of State | Source: Home / Self Care | Attending: Emergency Medicine | Admitting: Emergency Medicine

## 2016-11-04 ENCOUNTER — Emergency Department (HOSPITAL_COMMUNITY)
Admission: EM | Admit: 2016-11-04 | Discharge: 2016-11-04 | Disposition: A | Discharge status: Home / Self Care | Payer: Medicaid - Out of State | Attending: Emergency Medicine | Admitting: Emergency Medicine

## 2016-11-04 DIAGNOSIS — R1013 Epigastric pain: Secondary | ICD-10-CM | POA: Diagnosis present

## 2016-11-04 DIAGNOSIS — F17200 Nicotine dependence, unspecified, uncomplicated: Secondary | ICD-10-CM

## 2016-11-04 DIAGNOSIS — R1010 Upper abdominal pain, unspecified: Secondary | ICD-10-CM

## 2016-11-04 DIAGNOSIS — K2920 Alcoholic gastritis without bleeding: Secondary | ICD-10-CM | POA: Insufficient documentation

## 2016-11-04 DIAGNOSIS — J45909 Unspecified asthma, uncomplicated: Secondary | ICD-10-CM

## 2016-11-04 DIAGNOSIS — F17210 Nicotine dependence, cigarettes, uncomplicated: Secondary | ICD-10-CM | POA: Insufficient documentation

## 2016-11-04 DIAGNOSIS — Z79899 Other long term (current) drug therapy: Secondary | ICD-10-CM | POA: Insufficient documentation

## 2016-11-04 LAB — CBC
HEMATOCRIT: 37.1 % (ref 36.0–46.0)
HEMOGLOBIN: 12.7 g/dL (ref 12.0–15.0)
MCH: 31 pg (ref 26.0–34.0)
MCHC: 34.2 g/dL (ref 30.0–36.0)
MCV: 90.5 fL (ref 78.0–100.0)
Platelets: 380 10*3/uL (ref 150–400)
RBC: 4.1 MIL/uL (ref 3.87–5.11)
RDW: 13.9 % (ref 11.5–15.5)
WBC: 10.7 10*3/uL — ABNORMAL HIGH (ref 4.0–10.5)

## 2016-11-04 LAB — LIPASE, BLOOD: LIPASE: 42 U/L (ref 11–51)

## 2016-11-04 LAB — COMPREHENSIVE METABOLIC PANEL
ALBUMIN: 3.6 g/dL (ref 3.5–5.0)
ALK PHOS: 68 U/L (ref 38–126)
ALT: 9 U/L — ABNORMAL LOW (ref 14–54)
ANION GAP: 9 (ref 5–15)
AST: 19 U/L (ref 15–41)
BUN: 9 mg/dL (ref 6–20)
CALCIUM: 9 mg/dL (ref 8.9–10.3)
CO2: 23 mmol/L (ref 22–32)
Chloride: 106 mmol/L (ref 101–111)
Creatinine, Ser: 0.71 mg/dL (ref 0.44–1.00)
GFR calc non Af Amer: >60 mL/min (ref >60)
GLUCOSE: 98 mg/dL (ref 65–99)
POTASSIUM: 3.7 mmol/L (ref 3.5–5.1)
SODIUM: 138 mmol/L (ref 135–145)
Total Bilirubin: 0.3 mg/dL (ref 0.3–1.2)
Total Protein: 6.4 g/dL — ABNORMAL LOW (ref 6.5–8.1)

## 2016-11-04 LAB — I-STAT BETA HCG BLOOD, ED (MC, WL, AP ONLY)

## 2016-11-04 MED ORDER — SUCRALFATE 1 G PO TABS: 1.0000 g | ORAL_TABLET | Freq: Three times a day (TID) | ORAL | 0 refills | Status: DC

## 2016-11-04 MED ORDER — GI COCKTAIL ~~LOC~~
30.0000 mL | Freq: Once | ORAL | Status: AC
  Administered 2016-11-04: 30 mL via ORAL
  Filled 2016-11-04: qty 30

## 2016-11-04 MED ORDER — ONDANSETRON 4 MG PO TBDP
4.0000 mg | ORAL_TABLET | Freq: Once | ORAL | Status: AC | PRN
  Administered 2016-11-04: 4 mg via ORAL

## 2016-11-04 MED ORDER — HALOPERIDOL 2 MG PO TABS
2.0000 mg | ORAL_TABLET | Freq: Once | ORAL | Status: AC
  Administered 2016-11-04: 2 mg via ORAL
  Filled 2016-11-04: qty 1

## 2016-11-04 MED ORDER — SODIUM CHLORIDE 0.9 % IV BOLUS (SEPSIS)
1000.0000 mL | Freq: Once | INTRAVENOUS | Status: AC
  Administered 2016-11-04: 1000 mL via INTRAVENOUS

## 2016-11-04 MED ORDER — SUCRALFATE 1 G PO TABS
1.0000 g | ORAL_TABLET | Freq: Once | ORAL | Status: AC
  Administered 2016-11-04: 1 g via ORAL
  Filled 2016-11-04: qty 1

## 2016-11-04 MED ORDER — DICYCLOMINE HCL 10 MG/ML IM SOLN
20.0000 mg | Freq: Once | INTRAMUSCULAR | Status: AC
  Administered 2016-11-04: 20 mg via INTRAMUSCULAR
  Filled 2016-11-04: qty 2

## 2016-11-04 MED ORDER — DICYCLOMINE HCL 10 MG PO CAPS: 10.0000 mg | ORAL_CAPSULE | Freq: Three times a day (TID) | ORAL | 0 refills | Status: DC | PRN

## 2016-11-04 MED ORDER — OXYCODONE-ACETAMINOPHEN 5-325 MG PO TABS
1.0000 | ORAL_TABLET | Freq: Once | ORAL | Status: AC
  Administered 2016-11-04: 1 via ORAL
  Filled 2016-11-04: qty 1

## 2016-11-04 MED ORDER — OMEPRAZOLE 20 MG PO CPDR: 20.0000 mg | DELAYED_RELEASE_CAPSULE | Freq: Every day | ORAL | 0 refills | Status: DC

## 2016-11-04 MED ORDER — ONDANSETRON 4 MG PO TBDP
ORAL_TABLET | ORAL | Status: AC
  Filled 2016-11-04: qty 1

## 2016-11-04 MED ORDER — PANTOPRAZOLE SODIUM 40 MG IV SOLR
40.0000 mg | Freq: Once | INTRAVENOUS | Status: AC
Start: 2016-11-04 — End: 2016-11-04
  Administered 2016-11-04: 40 mg via INTRAVENOUS
  Filled 2016-11-04: qty 40

## 2016-11-04 MED ORDER — OXYCODONE-ACETAMINOPHEN 5-325 MG PO TABS
2.0000 | ORAL_TABLET | Freq: Once | ORAL | Status: AC
  Administered 2016-11-04: 2 via ORAL
  Filled 2016-11-04: qty 2

## 2016-11-04 NOTE — ED Notes (Signed)
Attempted X2 to get blood was not able to get blood.

## 2016-11-04 NOTE — Discharge Instructions (Signed)
AVOID ALL ALCOHOL USE. YOU MAY TAKE NEXIUM, PRILOSEC (OMEPRAZOLE) OR PREVACID DAILY.

## 2016-11-04 NOTE — ED Triage Notes (Signed)
Per Pt, Pt is coming from home with complaints of abdominal pain that woke her up out of her sleep this morning with abdominal pain and diarrhea. Pt has hx fo Gastritis.

## 2016-11-04 NOTE — Discharge Instructions (Signed)
Abstain from alcohol, fatty or spicy foods while you have ongoing pain.  Take your prescriptions that were prescribed yesterday.  Return for worsening symptoms, including fever, intractable vomiting, black stools, or any other symptoms concerning to you.

## 2016-11-04 NOTE — ED Notes (Signed)
Called patient 3x, no response. 

## 2016-11-04 NOTE — ED Provider Notes (Signed)
[facility]-EMERGENCY DEPT Provider Note   CSN: [id number] Arrival date & time: 11/04/16  [id number]     History   Chief Complaint Chief Complaint  Patient presents with  . Abdominal Pain    HPI Shirley Mckinney is a 36 y.o. female.  The history is provided by the patient.  Abdominal Pain   This is a recurrent problem. The current episode started more than 2 days ago. The problem occurs constantly. The problem has been gradually worsening. The pain is associated with alcohol use. The pain is located in the epigastric region. The pain is severe. Associated symptoms include diarrhea, nausea and vomiting. Pertinent negatives include fever, hematochezia, melena, dysuria and frequency. The symptoms are aggravated by palpation and eating. Nothing relieves the symptoms.   36 year old Female who presents with abdominal pain. She has a history of appendectomy, prior cesarean section, and gastritis. States that 3 days ago she had some alcohol, and had worsening of epigastric abdominal pain. States that this is consistent with her previous history of gastritis. Recently moved from [place] earlier this month, states that she was unable to fill her Percocet here for pain control. States that she plans on returning to [place] tomorrow to fill her pain prescriptions. It's that she was here in the emergency department 2 times over the past 3 days for symptom control. States that her treatments did not significantly improve her pain, and this morning had worsening pain. Pain is worse with eating. Endorses clear fluid vomitus with loose stools but no melena or hematochezia. No for surgical. States that usually when she had this in [place] that she would get morphine, and it would improve her symptoms.   Past Medical History:  Diagnosis Date  . Asthma   . Gastritis     There are no active problems to display for this patient.   Past Surgical History:  Procedure Laterality Date  . APPENDECTOMY      OB  History    No data available       Home Medications    Prior to Admission medications   Medication Sig Start Date End Date Taking? Authorizing Provider  albuterol (PROVENTIL HFA;VENTOLIN HFA) 108 (90 Base) MCG/ACT inhaler Inhale 1-2 puffs into the lungs every 6 (six) hours as needed for wheezing or shortness of breath.   Yes [provider]  albuterol (PROVENTIL) (2.5 MG/3ML) 0.083% nebulizer solution Take 2.5 mg by nebulization every 6 (six) hours as needed for wheezing or shortness of breath.   Yes [provider]  dicyclomine (BENTYL) 20 MG tablet Take 1 tablet (20 mg total) by mouth 2 (two) times daily. Patient taking differently: Take 20 mg by mouth daily as needed for spasms.  05/09/16  Yes Bethann BerkshireZammit, Joseph, MD  famotidine (PEPCID) 20 MG tablet Take 20 mg by mouth daily as needed for heartburn.  03/07/16  Yes [provider]  oxyCODONE-acetaminophen (PERCOCET) 10-325 MG tablet Take 1 tablet by mouth every 4 (four) hours as needed for pain.   Yes [provider]  Alum & Mag Hydroxide-Simeth (GI COCKTAIL) SUSP suspension Take 30 mLs by mouth 2 (two) times daily as needed for indigestion (gastritis pain). Shake well. Patient not taking: Reported on 11/04/2016 10/30/16   Trixie DredgeWest, Emily, PA-C  ondansetron (ZOFRAN) 4 MG tablet Take 1 tablet (4 mg total) by mouth every 8 (eight) hours as needed for nausea or vomiting. Patient not taking: Reported on 11/03/2016 10/30/16   Trixie DredgeWest, Emily, PA-C  pantoprazole (PROTONIX) 20  MG tablet Take 1 tablet (20 mg total) by mouth daily. Patient not taking: Reported on 11/03/2016 05/09/16   Bethann Berkshire, MD  pantoprazole (PROTONIX) 20 MG tablet Take 1 tablet (20 mg total) by mouth daily. Patient not taking: Reported on 11/04/2016 10/30/16   Trixie Dredge, PA-C  predniSONE (DELTASONE) 20 MG tablet Take 3 tablets (60 mg total) by mouth daily. Patient not taking: Reported on 11/04/2016 11/03/16 11/08/16  Audry Pili, PA-C    Family  History No family history on file.  Social History Social History  Substance Use Topics  . Smoking status: Current Every Day Smoker  . Smokeless tobacco: Never Used  . Alcohol use No     Allergies   Ibuprofen; Penicillins; Tramadol; and Asa [aspirin]   Review of Systems Review of Systems  Constitutional: Negative for fever.  Gastrointestinal: Positive for abdominal pain, diarrhea, nausea and vomiting. Negative for hematochezia and melena.  Genitourinary: Negative for dysuria and frequency.  All other systems reviewed and are negative.    Physical Exam Updated Vital Signs BP 108/73   Pulse 65   Temp 98.4 F (36.9 C) (Oral)   Resp 17   Ht 5\' 2"  (1.575 m)   Wt 79.4 kg (175 lb)   LMP 10/28/2016 Comment: spotting 2 days ago  SpO2 100%   BMI 32.01 kg/m   Physical Exam Physical Exam  Nursing note and vitals reviewed. Constitutional: non-toxic, well appearing, well nourished Head: Normocephalic and atraumatic.  Mouth/Throat: Oropharynx is clear and moist.  Neck: Normal range of motion. Neck supple.  Cardiovascular: Normal rate and regular rhythm.   Pulmonary/Chest: Effort normal and breath sounds normal.  Abdominal: Soft. No distension. There is epigastric tenderness. There is no rebound and no guarding.  Musculoskeletal: Normal range of motion.  Neurological: Alert, no facial droop, fluent speech, moves all extremities symmetrically Skin: Skin is warm and dry.  Psychiatric: Cooperative   ED Treatments / Results  Labs (all labs ordered are listed, but only abnormal results are displayed) Labs Reviewed  COMPREHENSIVE METABOLIC PANEL - Abnormal; Notable for the following:       Result Value   Total Protein 6.4 (*)    ALT 9 (*)    All other components within normal limits  CBC - Abnormal; Notable for the following:    WBC 10.7 (*)    All other components within normal limits  LIPASE, BLOOD  I-STAT BETA HCG BLOOD, ED ([facility], [facility], [facility] ONLY)    EKG  EKG  Interpretation None       Radiology No results found.  Procedures Procedures (including critical care time)  Medications Ordered in ED Medications  ondansetron (ZOFRAN-ODT) 4 MG disintegrating tablet (not administered)  ondansetron (ZOFRAN-ODT) disintegrating tablet 4 mg (4 mg Oral Given 11/04/16 0746)  sodium chloride 0.9 % bolus 1,000 mL (0 mLs Intravenous Stopped 11/04/16 1120)  dicyclomine (BENTYL) injection 20 mg (20 mg Intramuscular Given 11/04/16 0950)  pantoprazole (PROTONIX) injection 40 mg (40 mg Intravenous Given 11/04/16 0950)  gi cocktail (Maalox,Lidocaine,Donnatal) (30 mLs Oral Given 11/04/16 0950)  oxyCODONE-acetaminophen (PERCOCET/ROXICET) 5-325 MG per tablet 2 tablet (2 tablets Oral Given 11/04/16 1120)     Initial Impression / Assessment and Plan / ED Course  I have reviewed the triage vital signs and the nursing notes.  Pertinent labs & imaging results that were available during my care of the patient were reviewed by me and considered in my medical decision making (see chart for details).     36 year old female with  recent diagnosis gastritis who presents with persistent epigastric abdominal pain. She has not filled any of her recent medications due to her Medicaid being only valid in [place]. She states that she is going back to [place] today to fill her medications.  My evaluation she is nontoxic in no acute distress her vitals are within normal limits. She has a soft non-peritoneal abdomen. Pain localized in the epigastrium. CBC, comprehensive metabolic panel, lipase and pregnancy are unremarkable. Does keep asking for IV morphine in ED, but on my evaluation she is comfortably sleeping after receiving GI cocktail, Protonix, Bentyl, and IV fluids. Felt stable for discharge home. Strict return and follow-up instructions reviewed. She expressed understanding of all discharge instructions and felt comfortable with the plan of care.    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

## 2016-11-04 NOTE — ED Notes (Signed)
Pt requesting food. Crackers given.

## 2016-11-04 NOTE — ED Triage Notes (Signed)
Per Pt, Pt is returning to the emergency department after being discharged this morning. Pt reports abdominal pain that has continued since she left. No new vomiting or diarrhea since she has left the ED.

## 2016-11-05 NOTE — ED Provider Notes (Signed)
[facility] Provider Note   CSN: [id number] Arrival date & time: 11/04/16  1418     History   Chief Complaint Chief Complaint  Patient presents with  . Abdominal Pain    HPI Shirley Mckinney is a 36 y.o. female.  36yo F w/ PMH including asthma and gastritis who p/w abdominal pain. The patient has been to the ED several times recently for upper abdominal pain, discharged this morning after evaluation. She states her upper abdominal pain has continued since she left, with no improvement after the medications she received in the ED. Her last drink of alcohol was 5-6 days ago before her symptoms started. She has followed in [place] but has moved here. Last prescriptions for medications including percocet and bentyl have run out and she has had difficulty with any prescriptions here because her medicaid is for state of [place]. No vomiting since this morning but ongoing nausea, no diarrhea or bloody stools. No fevers or urinary sx. No NSAID use.    The history is provided by the patient.  Abdominal Pain      Past Medical History:  Diagnosis Date  . Asthma   . Gastritis     There are no active problems to display for this patient.   Past Surgical History:  Procedure Laterality Date  . APPENDECTOMY      OB History    No data available       Home Medications    Prior to Admission medications   Medication Sig Start Date End Date Taking? Authorizing Provider  albuterol (PROVENTIL HFA;VENTOLIN HFA) 108 (90 Base) MCG/ACT inhaler Inhale 1-2 puffs into the lungs every 6 (six) hours as needed for wheezing or shortness of breath.   Yes [provider]  albuterol (PROVENTIL) (2.5 MG/3ML) 0.083% nebulizer solution Take 2.5 mg by nebulization every 6 (six) hours as needed for wheezing or shortness of breath.   Yes [provider]  alum & mag hydroxide-simeth (MAALOX/MYLANTA) 200-200-20 MG/5ML suspension Take 30 mLs by mouth every 6 (six) hours as needed for indigestion  or heartburn.   Yes [provider]  famotidine (PEPCID) 20 MG tablet Take 20 mg by mouth daily as needed for heartburn.  03/07/16  Yes [provider]  oxyCODONE-acetaminophen (PERCOCET) 10-325 MG tablet Take 1 tablet by mouth every 4 (four) hours as needed for pain.   Yes [provider]  dicyclomine (BENTYL) 10 MG capsule Take 1 capsule (10 mg total) by mouth 3 (three) times daily as needed for spasms. 11/04/16   Deagen Krass, Ambrose Finland, MD  omeprazole (PRILOSEC) 20 MG capsule Take 1 capsule (20 mg total) by mouth daily. 11/04/16   Evalie Hargraves, Ambrose Finland, MD  ondansetron (ZOFRAN) 4 MG tablet Take 1 tablet (4 mg total) by mouth every 8 (eight) hours as needed for nausea or vomiting. Patient not taking: Reported on 11/04/2016 10/30/16   Trixie Dredge, PA-C  predniSONE (DELTASONE) 20 MG tablet Take 3 tablets (60 mg total) by mouth daily. Patient not taking: Reported on 11/04/2016 11/03/16 11/08/16  Audry Pili, PA-C  sucralfate (CARAFATE) 1 g tablet Take 1 tablet (1 g total) by mouth 4 (four) times daily -  with meals and at bedtime. 11/04/16 11/11/16  Mendi Constable, Ambrose Finland, MD    Family History No family history on file.  Social History Social History  Substance Use Topics  . Smoking status: Current Every Day Smoker  . Smokeless tobacco: Never Used  . Alcohol use No     Allergies  Ibuprofen; Penicillins; Tramadol; and Asa [aspirin]   Review of Systems Review of Systems  Gastrointestinal: Positive for abdominal pain.   All other systems reviewed and are negative except that which was mentioned in HPI   Physical Exam Updated Vital Signs BP 107/78   Pulse (!) 54   Temp 98.4 F (36.9 C) (Oral)   Resp 16   Ht 5\' 2"  (1.575 m)   Wt 79.4 kg (175 lb)   LMP 10/28/2016 Comment: spotting 2 days ago  SpO2 95%   BMI 32.01 kg/m   Physical Exam  Constitutional: She is oriented to person, place, and time. She appears well-developed and well-nourished.  Uncomfortable,  anxious  HENT:  Head: Normocephalic and atraumatic.  Moist mucous membranes  Eyes: Pupils are equal, round, and reactive to light. Conjunctivae are normal.  Neck: Neck supple.  Cardiovascular: Normal rate, regular rhythm and normal heart sounds.   No murmur heard. Pulmonary/Chest: Effort normal and breath sounds normal.  Abdominal: Soft. Bowel sounds are normal. She exhibits no distension. There is no tenderness.  While distracted with conversation, no focal abdominal tenderness although patient points to midepigastrium.  Musculoskeletal: She exhibits no edema.  Neurological: She is alert and oriented to person, place, and time.  Fluent speech  Skin: Skin is warm and dry.  Psychiatric:  Anxious, distressed  Nursing note and vitals reviewed.    ED Treatments / Results  Labs (all labs ordered are listed, but only abnormal results are displayed) Labs Reviewed - No data to display  EKG  EKG Interpretation None       Radiology No results found.  Procedures Procedures (including critical care time)  Medications Ordered in ED Medications  sucralfate (CARAFATE) tablet 1 g (1 g Oral Given 11/04/16 1811)  haloperidol (HALDOL) tablet 2 mg (2 mg Oral Given 11/04/16 1811)  oxyCODONE-acetaminophen (PERCOCET/ROXICET) 5-325 MG per tablet 1 tablet (1 tablet Oral Given 11/04/16 1821)     Initial Impression / Assessment and Plan / ED Course  I have reviewed the triage vital signs and the nursing notes.  Pertinent labs & imaging results that were available during my care of the patient were reviewed by me and considered in my medical decision making (see chart for details).     PT w/ ongoing upper abdominal pain since discharge from ED this morning. She had normal VS, was distractable during exam with no focal tenderness. I reviewed labs from recent evals which were normal and since her most recent labs were <12 hours ago I do not feel she needs any further labs currently. She has no  vaginal bleeding/disharge or pelvic pain to suggest GYN pathology as cause. She reports these sx are similar to previous gastritis and she has run out of home meds which may be exacerbating sx. She requested morphine but I explained that we do not use narcotics to treat this type of pain. Gave bentyl, carafate, haldol and pt became hungry, ate sandwich in ED. I gave her her home dose of percocet but explained that her PCP would have to refill any prescriptions. She understands that she needs to f/u in clinic for GI referral, and I have given Rx for PPI and carafate. Pt discharged in satisfactory condition.  Final Clinical Impressions(s) / ED Diagnoses   Final diagnoses:  Pain of upper abdomen    New Prescriptions Discharge Medication List as of 11/04/2016  7:49 PM    START taking these medications   Details  dicyclomine (BENTYL) 10 MG capsule Take  1 capsule (10 mg total) by mouth 3 (three) times daily as needed for spasms., Starting Thu 11/04/2016, Print    omeprazole (PRILOSEC) 20 MG capsule Take 1 capsule (20 mg total) by mouth daily., Starting Thu 11/04/2016, Print    sucralfate (CARAFATE) 1 g tablet Take 1 tablet (1 g total) by mouth 4 (four) times daily -  with meals and at bedtime., Starting Thu 11/04/2016, Until Thu 11/11/2016, Print         Jerrion Tabbert, Ambrose Finlandachel Morgan, MD 11/05/16 1254

## 2016-11-22 ENCOUNTER — Emergency Department (HOSPITAL_COMMUNITY): Payer: Medicaid Other

## 2016-11-22 ENCOUNTER — Emergency Department (HOSPITAL_COMMUNITY)
Admission: EM | Admit: 2016-11-22 | Discharge: 2016-11-22 | Disposition: A | Discharge status: Home / Self Care | Payer: Medicaid Other | Attending: Emergency Medicine | Admitting: Emergency Medicine

## 2016-11-22 ENCOUNTER — Encounter (HOSPITAL_COMMUNITY): Payer: Self-pay | Admitting: Emergency Medicine

## 2016-11-22 DIAGNOSIS — R1013 Epigastric pain: Secondary | ICD-10-CM | POA: Diagnosis not present

## 2016-11-22 DIAGNOSIS — F17200 Nicotine dependence, unspecified, uncomplicated: Secondary | ICD-10-CM | POA: Diagnosis not present

## 2016-11-22 DIAGNOSIS — Z79899 Other long term (current) drug therapy: Secondary | ICD-10-CM | POA: Diagnosis not present

## 2016-11-22 DIAGNOSIS — R05 Cough: Secondary | ICD-10-CM | POA: Diagnosis not present

## 2016-11-22 DIAGNOSIS — R197 Diarrhea, unspecified: Secondary | ICD-10-CM | POA: Diagnosis not present

## 2016-11-22 DIAGNOSIS — R062 Wheezing: Secondary | ICD-10-CM | POA: Diagnosis not present

## 2016-11-22 DIAGNOSIS — R0602 Shortness of breath: Secondary | ICD-10-CM | POA: Insufficient documentation

## 2016-11-22 DIAGNOSIS — R1012 Left upper quadrant pain: Secondary | ICD-10-CM

## 2016-11-22 DIAGNOSIS — R112 Nausea with vomiting, unspecified: Secondary | ICD-10-CM | POA: Diagnosis not present

## 2016-11-22 DIAGNOSIS — J45909 Unspecified asthma, uncomplicated: Secondary | ICD-10-CM | POA: Diagnosis not present

## 2016-11-22 LAB — URINALYSIS, ROUTINE W REFLEX MICROSCOPIC
Bilirubin Urine: NEGATIVE
GLUCOSE, UA: NEGATIVE mg/dL
Hgb urine dipstick: NEGATIVE
Ketones, ur: NEGATIVE mg/dL
LEUKOCYTES UA: NEGATIVE
Nitrite: NEGATIVE
PH: 6 (ref 5.0–8.0)
PROTEIN: NEGATIVE mg/dL
Specific Gravity, Urine: 1.012 (ref 1.005–1.030)

## 2016-11-22 LAB — COMPREHENSIVE METABOLIC PANEL
ALT: 11 U/L — ABNORMAL LOW (ref 14–54)
AST: 17 U/L (ref 15–41)
Albumin: 3.8 g/dL (ref 3.5–5.0)
Alkaline Phosphatase: 64 U/L (ref 38–126)
Anion gap: 8 (ref 5–15)
BUN: 7 mg/dL (ref 6–20)
CHLORIDE: 103 mmol/L (ref 101–111)
CO2: 25 mmol/L (ref 22–32)
Calcium: 9 mg/dL (ref 8.9–10.3)
Creatinine, Ser: 0.72 mg/dL (ref 0.44–1.00)
Glucose, Bld: 95 mg/dL (ref 65–99)
POTASSIUM: 3.6 mmol/L (ref 3.5–5.1)
Sodium: 136 mmol/L (ref 135–145)
Total Bilirubin: 0.3 mg/dL (ref 0.3–1.2)
Total Protein: 7.1 g/dL (ref 6.5–8.1)

## 2016-11-22 LAB — CBC
HEMATOCRIT: 37.2 % (ref 36.0–46.0)
Hemoglobin: 12.7 g/dL (ref 12.0–15.0)
MCH: 31 pg (ref 26.0–34.0)
MCHC: 34.1 g/dL (ref 30.0–36.0)
MCV: 90.7 fL (ref 78.0–100.0)
Platelets: 436 10*3/uL — ABNORMAL HIGH (ref 150–400)
RBC: 4.1 MIL/uL (ref 3.87–5.11)
RDW: 13.9 % (ref 11.5–15.5)
WBC: 8.2 10*3/uL (ref 4.0–10.5)

## 2016-11-22 LAB — PREGNANCY, URINE: PREG TEST UR: NEGATIVE

## 2016-11-22 LAB — LIPASE, BLOOD: LIPASE: 43 U/L (ref 11–51)

## 2016-11-22 MED ORDER — ALBUTEROL SULFATE (2.5 MG/3ML) 0.083% IN NEBU
5.0000 mg | INHALATION_SOLUTION | Freq: Once | RESPIRATORY_TRACT | Status: AC
  Administered 2016-11-22: 5 mg via RESPIRATORY_TRACT
  Filled 2016-11-22: qty 6

## 2016-11-22 MED ORDER — PANTOPRAZOLE SODIUM 40 MG IV SOLR
40.0000 mg | Freq: Once | INTRAVENOUS | Status: AC
  Administered 2016-11-22: 40 mg via INTRAVENOUS
  Filled 2016-11-22: qty 40

## 2016-11-22 MED ORDER — ONDANSETRON HCL 4 MG/2ML IJ SOLN
4.0000 mg | Freq: Once | INTRAMUSCULAR | Status: AC
  Administered 2016-11-22: 4 mg via INTRAVENOUS
  Filled 2016-11-22: qty 2

## 2016-11-22 MED ORDER — ACETAMINOPHEN 500 MG PO TABS
1000.0000 mg | ORAL_TABLET | Freq: Once | ORAL | Status: AC
  Administered 2016-11-22: 1000 mg via ORAL
  Filled 2016-11-22: qty 2

## 2016-11-22 MED ORDER — IOPAMIDOL (ISOVUE-300) INJECTION 61%
INTRAVENOUS | Status: AC
  Administered 2016-11-22: 100 mL
  Filled 2016-11-22: qty 100

## 2016-11-22 MED ORDER — SUCRALFATE 1 GM/10ML PO SUSP
1.0000 g | Freq: Three times a day (TID) | ORAL | Status: DC
  Filled 2016-11-22: qty 10

## 2016-11-22 MED ORDER — FENTANYL CITRATE (PF) 100 MCG/2ML IJ SOLN
50.0000 ug | Freq: Once | INTRAMUSCULAR | Status: AC
  Administered 2016-11-22: 50 ug via INTRAVENOUS
  Filled 2016-11-22: qty 2

## 2016-11-22 MED ORDER — SUCRALFATE 1 GM/10ML PO SUSP: 1.0000 g | Freq: Three times a day (TID) | ORAL | 0 refills | Status: DC

## 2016-11-22 MED ORDER — PREDNISONE 50 MG PO TABS: 50.0000 mg | ORAL_TABLET | Freq: Every day | ORAL | 0 refills | Status: DC

## 2016-11-22 MED ORDER — DICYCLOMINE HCL 10 MG/ML IM SOLN
20.0000 mg | Freq: Once | INTRAMUSCULAR | Status: DC
  Filled 2016-11-22: qty 2

## 2016-11-22 MED ORDER — SUCRALFATE 1 GM/10ML PO SUSP
1.0000 g | Freq: Once | ORAL | Status: AC
  Administered 2016-11-22: 1 g via ORAL

## 2016-11-22 MED ORDER — GI COCKTAIL ~~LOC~~
30.0000 mL | Freq: Once | ORAL | Status: AC
  Administered 2016-11-22: 30 mL via ORAL
  Filled 2016-11-22: qty 30

## 2016-11-22 MED ORDER — SODIUM CHLORIDE 0.9 % IV BOLUS (SEPSIS)
1000.0000 mL | Freq: Once | INTRAVENOUS | Status: AC
  Administered 2016-11-22: 1000 mL via INTRAVENOUS

## 2016-11-22 MED ORDER — DICYCLOMINE HCL 10 MG/ML IM SOLN
20.0000 mg | Freq: Once | INTRAMUSCULAR | Status: AC
  Administered 2016-11-22: 20 mg via INTRAMUSCULAR
  Filled 2016-11-22: qty 2

## 2016-11-22 NOTE — ED Notes (Signed)
Pt awakens and request pain medication for abdominal pain. Will inform provider.

## 2016-11-22 NOTE — ED Notes (Signed)
Pt states she understands instructions nand medications she received today. Home stable with friend via w/c.

## 2016-11-22 NOTE — ED Notes (Signed)
Pt rouses to voice and repositions herself but then returns to sleep with even sonorous respirations.

## 2016-11-22 NOTE — ED Notes (Signed)
Pt tearful and hyperventilating . Will inform provider.

## 2016-11-22 NOTE — ED Notes (Addendum)
Pt request morphine as pain medicine. states she refused bentyl as she took it at home with relief. Will inform provider of request.

## 2016-11-22 NOTE — Discharge Instructions (Signed)
Medications: prednisone, Carafate  Treatment: Take prednisone for 5 days for your asthma. Continue using your albuterol treatments at home as prescribed. Take Carafate 4 times daily, before each meal and before bed. Continue taking your Bentyl and antinausea medicine at home.  Follow-up: Please follow-up with the gastroenterologist as soon as possible for further evaluation and treatment of your abdominal pain. Please return to emergency department if you develop any new or worsening symptoms including fever, difficulty breathing, intractable vomiting, or any other new or concerning symptoms.

## 2016-11-22 NOTE — ED Notes (Signed)
Patient transported to CT 

## 2016-11-22 NOTE — ED Notes (Signed)
Pt walks to bathroom with steady gait.

## 2016-11-22 NOTE — ED Triage Notes (Signed)
Patient with abdominal pain, nausea, vomiting and diarrhea.  She states that it started 3 hours ago.

## 2016-11-22 NOTE — ED Notes (Signed)
Patient transported to X-ray 

## 2016-11-22 NOTE — ED Provider Notes (Signed)
[facility] Provider Note   CSN: [id number] Arrival date & time: 11/22/16  0549     History   Chief Complaint Chief Complaint  Patient presents with  . Emesis  . Nausea  . Diarrhea    HPI Shirley Mckinney is a 36 y.o. female with history of gastritis, asthma who presents with acute onset of upper abdominal pain, nausea, vomiting, diarrhea that began around 3 AM. Patient has history of gastritis, but does not take any regular medications at home at this point because she does not have a gastroenterologist in [place] yet, as she just recently moved from [place]. Patient did take up Bentyl this morning without relief. Patient describes her abdominal pain is crampy and constant. It is typical of her chronic pain. She reports feeling hot, but no documented fever. She denies any chest pain. She does note that her asthma has been acting up lately in that she has had a cough, shortness of breath, and wheezing for the past couple weeks. She wakes up coughing and wheezing at night. She has been using her nebulizer at home with mild to moderate relief. She denies any urinary symptoms, abnormal vaginal bleeding or discharge. Patient reports she does not drink alcohol anymore.  HPI  Past Medical History:  Diagnosis Date  . Asthma   . Gastritis     There are no active problems to display for this patient.   Past Surgical History:  Procedure Laterality Date  . APPENDECTOMY      OB History    No data available       Home Medications    Prior to Admission medications   Medication Sig Start Date End Date Taking? Authorizing Provider  dicyclomine (BENTYL) 10 MG capsule Take 1 capsule (10 mg total) by mouth 3 (three) times daily as needed for spasms. 11/04/16  Yes Little, Ambrose Finland, MD  omeprazole (PRILOSEC) 20 MG capsule Take 1 capsule (20 mg total) by mouth daily. Patient not taking: Reported on 11/22/2016 11/04/16   Little, Ambrose Finland, MD  ondansetron (ZOFRAN) 4 MG  tablet Take 1 tablet (4 mg total) by mouth every 8 (eight) hours as needed for nausea or vomiting. Patient not taking: Reported on 11/04/2016 10/30/16   Trixie Dredge, PA-C  predniSONE (DELTASONE) 50 MG tablet Take 1 tablet (50 mg total) by mouth daily. 11/22/16   Norena Bratton, Waylan Boga, PA-C  sucralfate (CARAFATE) 1 GM/10ML suspension Take 10 mLs (1 g total) by mouth 4 (four) times daily -  with meals and at bedtime. 11/22/16   Emi Holes, PA-C    Family History No family history on file.  Social History Social History  Substance Use Topics  . Smoking status: Current Every Day Smoker  . Smokeless tobacco: Never Used  . Alcohol use No     Allergies   Ibuprofen; Penicillins; Tramadol; and Asa [aspirin]   Review of Systems Review of Systems  Constitutional: Negative for chills and fever.  HENT: Negative for facial swelling and sore throat.   Respiratory: Positive for cough, shortness of breath and wheezing.   Cardiovascular: Negative for chest pain.  Gastrointestinal: Positive for abdominal pain, diarrhea, nausea and vomiting. Negative for blood in stool.  Genitourinary: Negative for dysuria.  Musculoskeletal: Negative for back pain.  Skin: Negative for rash and wound.  Neurological: Negative for headaches.  Psychiatric/Behavioral: The patient is not nervous/anxious.      Physical Exam Updated Vital Signs BP 97/64   Pulse 65   Temp 97.9  F (36.6 C) (Oral)   Resp 20   LMP 11/07/2016 (Exact Date) Comment: spotting 2 days ago  SpO2 100%   Physical Exam  Constitutional: She appears well-developed and well-nourished. No distress.  Tearful  HENT:  Head: Normocephalic and atraumatic.  Mouth/Throat: Oropharynx is clear and moist. No oropharyngeal exudate.  Eyes: Pupils are equal, round, and reactive to light. Conjunctivae are normal. Right eye exhibits no discharge. Left eye exhibits no discharge. No scleral icterus.  Neck: Normal range of motion. Neck supple. No thyromegaly  present.  Cardiovascular: Normal rate, regular rhythm, normal heart sounds and intact distal pulses.  Exam reveals no gallop and no friction rub.   No murmur heard. Pulmonary/Chest: Effort normal and breath sounds normal. No stridor. No respiratory distress. She has no wheezes. She has no rales.  Expiratory wheezing bilaterally  Abdominal: Soft. Bowel sounds are normal. She exhibits no distension. There is tenderness in the epigastric area and left upper quadrant. There is no rebound, no guarding, no tenderness at McBurney's point and negative Murphy's sign.  Musculoskeletal: She exhibits no edema.  Lymphadenopathy:    She has no cervical adenopathy.  Neurological: She is alert. Coordination normal.  Skin: Skin is warm and dry. No rash noted. She is not diaphoretic. No pallor.  Psychiatric: She has a normal mood and affect.  Nursing note and vitals reviewed.    ED Treatments / Results  Labs (all labs ordered are listed, but only abnormal results are displayed) Labs Reviewed  COMPREHENSIVE METABOLIC PANEL - Abnormal; Notable for the following:       Result Value   ALT 11 (*)    All other components within normal limits  CBC - Abnormal; Notable for the following:    Platelets 436 (*)    All other components within normal limits  LIPASE, BLOOD  URINALYSIS, ROUTINE W REFLEX MICROSCOPIC  PREGNANCY, URINE  POC URINE PREG, ED    EKG  EKG Interpretation None       Radiology Dg Chest 2 View  Result Date: 11/22/2016 CLINICAL DATA:  Cough and chest pain. EXAM: CHEST  2 VIEW COMPARISON:  Acute abdominal series 05/09/2016 FINDINGS: The heart size and mediastinal contours are within normal limits. Both lungs are clear. The visualized skeletal structures are unremarkable. IMPRESSION: Negative two view chest x-ray Electronically Signed   By: Marin Robertshristopher  Mattern M.D.   On: 11/22/2016 07:08   Ct Abdomen Pelvis W Contrast  Result Date: 11/22/2016 CLINICAL DATA:  36 year old female with  epigastric pain. EXAM: CT ABDOMEN AND PELVIS WITH CONTRAST TECHNIQUE: Multidetector CT imaging of the abdomen and pelvis was performed using the standard protocol following bolus administration of intravenous contrast. CONTRAST:  100mL ISOVUE-300 IOPAMIDOL (ISOVUE-300) INJECTION 61% COMPARISON:  None. FINDINGS: Lower chest: No acute abnormality. Hepatobiliary: No focal liver abnormality is seen. No gallstones, gallbladder wall thickening, or biliary dilatation. Pancreas: Unremarkable. No pancreatic ductal dilatation or surrounding inflammatory changes. Spleen: Normal in size without focal abnormality. Adrenals/Urinary Tract: Adrenal glands are unremarkable. Kidneys are normal, without renal calculi, focal lesion, or hydronephrosis. Bladder is unremarkable. Stomach/Bowel: Stomach is within normal limits. Status post appendectomy. No evidence of bowel wall thickening, distention, or inflammatory changes. Epigastric pain. Vascular/Lymphatic: No significant vascular findings are present. No enlarged abdominal or pelvic lymph nodes. Reproductive: Uterus and bilateral adnexa are unremarkable. Other: No abdominal wall hernia or abnormality. No abdominopelvic ascites. Musculoskeletal: No acute or significant osseous findings. IMPRESSION: Negative CT evaluation of the abdomen and pelvis. Electronically Signed   By: Casimer BilisSerena  Marisa Sprinkles M.D.   On: 11/22/2016 13:09    Procedures Procedures (including critical care time)  Medications Ordered in ED Medications  pantoprazole (PROTONIX) injection 40 mg (40 mg Intravenous Given 11/22/16 0648)  sodium chloride 0.9 % bolus 1,000 mL (0 mLs Intravenous Stopped 11/22/16 1027)  ondansetron (ZOFRAN) injection 4 mg (4 mg Intravenous Given 11/22/16 0648)  gi cocktail (Maalox,Lidocaine,Donnatal) (30 mLs Oral Given 11/22/16 0648)  albuterol (PROVENTIL) (2.5 MG/3ML) 0.083% nebulizer solution 5 mg (5 mg Nebulization Given 11/22/16 0708)  dicyclomine (BENTYL) injection 20 mg (20 mg  Intramuscular Given 11/22/16 0735)  acetaminophen (TYLENOL) tablet 1,000 mg (1,000 mg Oral Given 11/22/16 1042)  fentaNYL (SUBLIMAZE) injection 50 mcg (50 mcg Intravenous Given 11/22/16 1118)  iopamidol (ISOVUE-300) 61 % injection (100 mLs  Contrast Given 11/22/16 1244)  fentaNYL (SUBLIMAZE) injection 50 mcg (50 mcg Intravenous Given 11/22/16 1235)  sucralfate (CARAFATE) 1 GM/10ML suspension 1 g (1 g Oral Given 11/22/16 1404)     Initial Impression / Assessment and Plan / ED Course  I have reviewed the triage vital signs and the nursing notes.  Pertinent labs & imaging results that were available during my care of the patient were reviewed by me and considered in my medical decision making (see chart for details).     After patient given IV Protonix, fluids, Zofran, GI cocktail, and refusing Bentyl, patient requesting morphine for further pain control. I discussed with patient that I am Bentyl may be more beneficial long-term than morphine and she agrees to try the Bentyl.  On reevaluation after Bentyl IM injection, patient sleeping and snoring in the room.  Patient intermittently waking up and continuing to request morphine for pain. Patient is hyperventilating and crying, reportedly due to pain. Will order CTAP considering contiued pain and progressive decrease in BP. Will also give fentanyl .  Patient requesting more pain medication after return from CT. Blood pressure 110 systolic, will repeat fentanyl 50 g.   CT abdomen and pelvis is negative. Labs unremarkable, except PLT 436K. patient continued to request pain medication. I continued to tell patient that her blood pressure could not tolerate any further or different pain medication, and with a negative CT scan, there was no indication for narcotic pain medication. On reevaluation after albuterol, patient continues to wheeze. I offered repeat nebulizer treatment, however patient wishing to leave after I gave her CT results and told her  he cannot do anything else for pain. She asks for prednisone for her asthma. I'll discharge with prednisone and Carafate. I advised patient that she will need to follow up with gastroenterology, she was given resources. Return precautions discussed. Patient discharged in satisfactory condition. I discussed patient case with Dr. Lynelle Doctor at length who guided the patient's management and agrees with plan.   Final Clinical Impressions(s) / ED Diagnoses   Final diagnoses:  Nausea vomiting and diarrhea  Left upper quadrant pain    New Prescriptions New Prescriptions   PREDNISONE (DELTASONE) 50 MG TABLET    Take 1 tablet (50 mg total) by mouth daily.   SUCRALFATE (CARAFATE) 1 GM/10ML SUSPENSION    Take 10 mLs (1 g total) by mouth 4 (four) times daily -  with meals and at bedtime.     Emi Holes, PA-C 11/22/16 1432    Linwood Dibbles, MD 11/23/16 [phone]

## 2016-11-22 NOTE — ED Notes (Signed)
PA law awareof BP.

## 2016-11-22 NOTE — ED Notes (Addendum)
Pt rouses and request pain meds. Explained that she had been sleeping for last 2 hours. Given fluids and urged to attempt po challenge.Will inform provider of request.

## 2016-11-25 ENCOUNTER — Emergency Department (HOSPITAL_COMMUNITY)
Admission: EM | Admit: 2016-11-25 | Discharge: 2016-11-25 | Disposition: A | Discharge status: Home / Self Care | Payer: Medicaid - Out of State | Attending: Emergency Medicine | Admitting: Emergency Medicine

## 2016-11-25 ENCOUNTER — Encounter (HOSPITAL_COMMUNITY): Payer: Self-pay | Admitting: Emergency Medicine

## 2016-11-25 DIAGNOSIS — K2950 Unspecified chronic gastritis without bleeding: Secondary | ICD-10-CM | POA: Diagnosis not present

## 2016-11-25 DIAGNOSIS — Z87891 Personal history of nicotine dependence: Secondary | ICD-10-CM | POA: Insufficient documentation

## 2016-11-25 DIAGNOSIS — J45909 Unspecified asthma, uncomplicated: Secondary | ICD-10-CM | POA: Insufficient documentation

## 2016-11-25 DIAGNOSIS — R1013 Epigastric pain: Secondary | ICD-10-CM | POA: Diagnosis present

## 2016-11-25 DIAGNOSIS — R112 Nausea with vomiting, unspecified: Secondary | ICD-10-CM | POA: Diagnosis not present

## 2016-11-25 LAB — URINALYSIS, ROUTINE W REFLEX MICROSCOPIC
Bilirubin Urine: NEGATIVE
GLUCOSE, UA: NEGATIVE mg/dL
HGB URINE DIPSTICK: NEGATIVE
Ketones, ur: NEGATIVE mg/dL
Leukocytes, UA: NEGATIVE
NITRITE: NEGATIVE
Protein, ur: 30 mg/dL — AB
SPECIFIC GRAVITY, URINE: 1.021 (ref 1.005–1.030)
pH: 6 (ref 5.0–8.0)

## 2016-11-25 LAB — COMPREHENSIVE METABOLIC PANEL
ALBUMIN: 3.7 g/dL (ref 3.5–5.0)
ALK PHOS: 51 U/L (ref 38–126)
ALT: 10 U/L — ABNORMAL LOW (ref 14–54)
ANION GAP: 11 (ref 5–15)
AST: 20 U/L (ref 15–41)
BILIRUBIN TOTAL: 0.5 mg/dL (ref 0.3–1.2)
BUN: 7 mg/dL (ref 6–20)
CALCIUM: 9.1 mg/dL (ref 8.9–10.3)
CO2: 25 mmol/L (ref 22–32)
CREATININE: 0.77 mg/dL (ref 0.44–1.00)
Chloride: 104 mmol/L (ref 101–111)
GFR calc Af Amer: >60 mL/min (ref >60)
GFR calc non Af Amer: >60 mL/min (ref >60)
GLUCOSE: 89 mg/dL (ref 65–99)
Potassium: 3.4 mmol/L — ABNORMAL LOW (ref 3.5–5.1)
Sodium: 140 mmol/L (ref 135–145)
TOTAL PROTEIN: 6.4 g/dL — AB (ref 6.5–8.1)

## 2016-11-25 LAB — CBC
HCT: 34 % — ABNORMAL LOW (ref 36.0–46.0)
Hemoglobin: 11.5 g/dL — ABNORMAL LOW (ref 12.0–15.0)
MCH: 30 pg (ref 26.0–34.0)
MCHC: 33.8 g/dL (ref 30.0–36.0)
MCV: 88.8 fL (ref 78.0–100.0)
PLATELETS: 432 10*3/uL — AB (ref 150–400)
RBC: 3.83 MIL/uL — ABNORMAL LOW (ref 3.87–5.11)
RDW: 13.6 % (ref 11.5–15.5)
WBC: 15.1 10*3/uL — ABNORMAL HIGH (ref 4.0–10.5)

## 2016-11-25 LAB — I-STAT BETA HCG BLOOD, ED (MC, WL, AP ONLY): I-stat hCG, quantitative: <5 m[IU]/mL (ref <5)

## 2016-11-25 LAB — LIPASE, BLOOD: Lipase: 40 U/L (ref 11–51)

## 2016-11-25 MED ORDER — ALBUTEROL SULFATE (2.5 MG/3ML) 0.083% IN NEBU: 2.5000 mg | INHALATION_SOLUTION | Freq: Four times a day (QID) | RESPIRATORY_TRACT | 12 refills | Status: DC | PRN

## 2016-11-25 MED ORDER — GI COCKTAIL ~~LOC~~
30.0000 mL | Freq: Once | ORAL | Status: AC
  Administered 2016-11-25: 30 mL via ORAL
  Filled 2016-11-25: qty 30

## 2016-11-25 MED ORDER — ALBUTEROL SULFATE HFA 108 (90 BASE) MCG/ACT IN AERS: 1.0000 | INHALATION_SPRAY | Freq: Four times a day (QID) | RESPIRATORY_TRACT | 0 refills | Status: DC | PRN

## 2016-11-25 MED ORDER — MORPHINE SULFATE (PF) 4 MG/ML IV SOLN
4.0000 mg | Freq: Once | INTRAVENOUS | Status: AC
  Administered 2016-11-25: 4 mg via INTRAVENOUS
  Filled 2016-11-25: qty 1

## 2016-11-25 MED ORDER — ONDANSETRON 4 MG PO TBDP
ORAL_TABLET | ORAL | Status: AC
  Filled 2016-11-25: qty 1

## 2016-11-25 MED ORDER — SODIUM CHLORIDE 0.9 % IV BOLUS (SEPSIS)
1000.0000 mL | Freq: Once | INTRAVENOUS | Status: AC
  Administered 2016-11-25: 1000 mL via INTRAVENOUS

## 2016-11-25 MED ORDER — ONDANSETRON 4 MG PO TBDP: 4.0000 mg | ORAL_TABLET | Freq: Three times a day (TID) | ORAL | 0 refills | Status: DC | PRN

## 2016-11-25 MED ORDER — DICYCLOMINE HCL 10 MG PO CAPS
10.0000 mg | ORAL_CAPSULE | Freq: Once | ORAL | Status: AC
  Administered 2016-11-25: 10 mg via ORAL
  Filled 2016-11-25: qty 1

## 2016-11-25 MED ORDER — ONDANSETRON 4 MG PO TBDP
4.0000 mg | ORAL_TABLET | Freq: Once | ORAL | Status: AC | PRN
  Administered 2016-11-25: 4 mg via ORAL

## 2016-11-25 MED ORDER — PANTOPRAZOLE SODIUM 40 MG IV SOLR
40.0000 mg | Freq: Once | INTRAVENOUS | Status: AC
  Administered 2016-11-25: 40 mg via INTRAVENOUS
  Filled 2016-11-25: qty 40

## 2016-11-25 MED ORDER — ONDANSETRON HCL 4 MG PO TABS
4.0000 mg | ORAL_TABLET | Freq: Once | ORAL | Status: AC
  Administered 2016-11-25: 4 mg via ORAL
  Filled 2016-11-25: qty 1

## 2016-11-25 NOTE — ED Triage Notes (Signed)
Pt sts N/V and abd pain with hx of gastritis; pt sts feels similar

## 2016-11-25 NOTE — ED Notes (Signed)
EDP at bedside  

## 2016-11-25 NOTE — ED Provider Notes (Signed)
Emergency Department Provider Note   I have reviewed the triage vital signs and the nursing notes.   HISTORY  Chief Complaint Abdominal Pain   HPI Shirley Mckinney is a 36 y.o. female with PMH of tobacco use, asthma, and gastritis Presents to the emergency department for evaluation of epigastric abdominal pain, nausea, vomiting. The patient has a history of gastritis and states this feels similar to prior episodes. She has not established care with a physician here as she reports recently moving from [place]. Does not have a gastroenterologist. She has medications at home including Carafate, Pepcid, and Bentyl which she tried to take this morning when symptoms worsened but had vomiting and was unable to keep them down. No fevers or chills. Pain radiates from the epigastrium to the left flank. No dysuria, hesitancy, urgency. No chest pain or difficulty breathing. Pain is severe and constant.    Past Medical History:  Diagnosis Date  . Asthma   . Gastritis     There are no active problems to display for this patient.   Past Surgical History:  Procedure Laterality Date  . APPENDECTOMY      Current Outpatient Rx  . Order #: [id number] Class: Historical Med  . Order #: [id number] Class: Historical Med  . Order #: [id number] Class: Print  . Order #: [id number] Class: Print  . Order #: [id number] Class: Historical Med  . Order #: [id number] Class: Historical Med  . Order #: [id number] Class: Print  . Order #: [id number] Class: Print  . Order #: [id number] Class: Print  . Order #: [id number] Class: Print  . Order #: [id number] Class: Print  . Order #: [id number] Class: Print    Allergies Ibuprofen; Penicillins; Tramadol; and Asa [aspirin]  History reviewed. No pertinent family history.  Social History Social History  Substance Use Topics  . Smoking status: Current Every Day Smoker  . Smokeless tobacco: Never Used  . Alcohol use No    Review of Systems  Constitutional: No  fever/chills Eyes: No visual changes. ENT: No sore throat. Cardiovascular: Denies chest pain. Respiratory: Denies shortness of breath. Gastrointestinal: Positive abdominal pain. Positive nausea and vomiting.  No diarrhea.  No constipation. Genitourinary: Negative for dysuria. Musculoskeletal: Negative for back pain. Skin: Negative for rash. Neurological: Negative for headaches, focal weakness or numbness.  10-point ROS otherwise negative.  ____________________________________________   PHYSICAL EXAM:  VITAL SIGNS: ED Triage Vitals [11/25/16 1155]  Enc Vitals Group     BP 114/79     Pulse Rate 68     Resp 18     Temp 98.6 F (37 C)     Temp Source Oral     SpO2 99 %     Weight 175 lb (79.4 kg)     Height 5\' 2"  (1.575 m)     Pain Score 10   Constitutional: Alert and oriented. Well appearing and in no acute distress. Ambulatory without difficulty.  Eyes: Conjunctivae are normal.  Head: Atraumatic. Nose: No congestion/rhinnorhea. Mouth/Throat: Mucous membranes are moist.  Oropharynx non-erythematous. Neck: No stridor.   Cardiovascular: Normal rate, regular rhythm. Good peripheral circulation. Grossly normal heart sounds.   Respiratory: Normal respiratory effort.  No retractions. Lungs CTAB. Gastrointestinal: Soft with mild epigastric tenderness to palpation. No distention.  Musculoskeletal: No lower extremity tenderness nor edema. No gross deformities of extremities. Neurologic:  Normal speech and language. No gross focal neurologic deficits are appreciated.  Skin:  Skin is warm, dry and intact. No rash noted.  ____________________________________________   LABS (all labs ordered are listed, but only abnormal  results are displayed)  Labs Reviewed  COMPREHENSIVE METABOLIC PANEL - Abnormal; Notable for the following:       Result Value   Potassium 3.4 (*)    Total Protein 6.4 (*)    ALT 10 (*)    All other components within normal limits  CBC - Abnormal; Notable for  the following:    WBC 15.1 (*)    RBC 3.83 (*)    Hemoglobin 11.5 (*)    HCT 34.0 (*)    Platelets 432 (*)    All other components within normal limits  URINALYSIS, ROUTINE W REFLEX MICROSCOPIC - Abnormal; Notable for the following:    APPearance HAZY (*)    Protein, ur 30 (*)    Bacteria, UA FEW (*)    Squamous Epithelial / LPF 6-30 (*)    All other components within normal limits  LIPASE, BLOOD  I-STAT BETA HCG BLOOD, ED ([facility], [facility], [facility] ONLY)    ____________________________________________  RADIOLOGY  None ____________________________________________   PROCEDURES  Procedure(s) performed:   Procedures  None ____________________________________________   INITIAL IMPRESSION / ASSESSMENT AND PLAN / ED COURSE  Pertinent labs & imaging results that were available during my care of the patient were reviewed by me and considered in my medical decision making (see chart for details).  Patient resents to the emergency room in for evaluation of epigastric abdominal discomfort with nausea and vomiting typical of her gastritis symptoms. She has mild tenderness on my exam. No active vomiting. She is able to without difficulty. She was seen in the emergency department 3 days prior had normal CT scan at that time. Abs significant only for leukocytosis. Plan for IV fluids and symptom management.   05:59 PM  Patient is feeling better and tolerating PO. Labs largely unremarkable. Refer to primary care physician as well as local gastroenterology. I provided refills for the patient's asthma medications per request.   I physically counseled the patient on smoking cessation and she reports she is currently trying to cut back.  At this time, I do not feel there is any life-threatening condition present. I have reviewed and discussed all results (EKG, imaging, lab, urine as appropriate), exam findings with patient. I have reviewed nursing notes and appropriate previous records.  I feel the  patient is safe to be discharged home without further emergent workup. Discussed usual and customary return precautions. Patient and family (if present) verbalize understanding and are comfortable with this plan.  Patient will follow-up with their primary care provider. If they do not have a primary care provider, information for follow-up has been provided to them. All questions have been answered.  ____________________________________________  FINAL CLINICAL IMPRESSION(S) / ED DIAGNOSES  Final diagnoses:  Chronic gastritis without bleeding, unspecified gastritis type  Non-intractable vomiting with nausea, unspecified vomiting type     MEDICATIONS GIVEN DURING THIS VISIT:  Medications  ondansetron (ZOFRAN-ODT) disintegrating tablet 4 mg (4 mg Oral Given 11/25/16 1159)  gi cocktail (Maalox,Lidocaine,Donnatal) (30 mLs Oral Given 11/25/16 1713)  dicyclomine (BENTYL) capsule 10 mg (10 mg Oral Given 11/25/16 1713)  ondansetron (ZOFRAN) tablet 4 mg (4 mg Oral Given 11/25/16 1713)  morphine 4 MG/ML injection 4 mg (4 mg Intravenous Given 11/25/16 1708)  sodium chloride 0.9 % bolus 1,000 mL (0 mLs Intravenous Stopped 11/25/16 1822)  pantoprazole (PROTONIX) injection 40 mg (40 mg Intravenous Given 11/25/16 1713)     NEW OUTPATIENT MEDICATIONS STARTED DURING THIS VISIT:  Discharge Medication List as of 11/25/2016  5:59 PM  START taking these medications   Details  !! albuterol (PROVENTIL HFA;VENTOLIN HFA) 108 (90 Base) MCG/ACT inhaler Inhale 1-2 puffs into the lungs every 6 (six) hours as needed for wheezing or shortness of breath., Starting Thu 11/25/2016, Print    !! albuterol (PROVENTIL) (2.5 MG/3ML) 0.083% nebulizer solution Take 3 mLs (2.5 mg total) by nebulization every 6 (six) hours as needed for wheezing or shortness of breath., Starting Thu 11/25/2016, Print     !! - Potential duplicate medications found. Please discuss with provider.        Note:  This document was prepared using  Dragon voice recognition software and may include unintentional dictation errors.  Alona BeneJoshua Halton Neas, MD Emergency Medicine    Trinnity Breunig, Arlyss RepressJoshua G, MD 11/26/16 [phone]

## 2016-11-25 NOTE — Discharge Instructions (Signed)

## 2016-12-26 ENCOUNTER — Emergency Department (HOSPITAL_COMMUNITY)
Admission: EM | Admit: 2016-12-26 | Discharge: 2016-12-26 | Disposition: A | Discharge status: Home / Self Care | Payer: Medicaid Other | Attending: Emergency Medicine | Admitting: Emergency Medicine

## 2016-12-26 ENCOUNTER — Encounter (HOSPITAL_COMMUNITY): Payer: Self-pay | Admitting: *Deleted

## 2016-12-26 DIAGNOSIS — Z79899 Other long term (current) drug therapy: Secondary | ICD-10-CM | POA: Diagnosis not present

## 2016-12-26 DIAGNOSIS — N3000 Acute cystitis without hematuria: Secondary | ICD-10-CM | POA: Diagnosis not present

## 2016-12-26 DIAGNOSIS — F17200 Nicotine dependence, unspecified, uncomplicated: Secondary | ICD-10-CM | POA: Insufficient documentation

## 2016-12-26 DIAGNOSIS — R300 Dysuria: Secondary | ICD-10-CM | POA: Diagnosis present

## 2016-12-26 DIAGNOSIS — J45909 Unspecified asthma, uncomplicated: Secondary | ICD-10-CM | POA: Diagnosis not present

## 2016-12-26 LAB — URINALYSIS, ROUTINE W REFLEX MICROSCOPIC
Bilirubin Urine: NEGATIVE
Glucose, UA: NEGATIVE mg/dL
Hgb urine dipstick: NEGATIVE
KETONES UR: NEGATIVE mg/dL
Nitrite: NEGATIVE
PH: 6 (ref 5.0–8.0)
PROTEIN: 30 mg/dL — AB
Specific Gravity, Urine: 1.018 (ref 1.005–1.030)

## 2016-12-26 LAB — POC URINE PREG, ED: Preg Test, Ur: NEGATIVE

## 2016-12-26 MED ORDER — NITROFURANTOIN MONOHYD MACRO 100 MG PO CAPS: 100.0000 mg | ORAL_CAPSULE | Freq: Two times a day (BID) | ORAL | 0 refills | Status: DC

## 2016-12-26 NOTE — ED Provider Notes (Signed)
[facility]-EMERGENCY DEPT Provider Note   CSN: [id number] Arrival date & time: 12/26/16  1238     History   Chief Complaint Chief Complaint  Patient presents with  . Dysuria    HPI Shirley Mckinney is a 36 y.o. female.  36 year old female with asthma and gastritis who presents with dysuria. The patient has had 2 days of dysuria as well as urinary frequency. She reports mild associated suprapubic abdominal discomfort but no severe abdominal pain and no back pain. She denies any fevers or vomiting, has reported mild nausea. She has had a urinary tract infection previously and this feels similar. She denies any vaginal bleeding or discharge.   The history is provided by the patient.  Dysuria      Past Medical History:  Diagnosis Date  . Asthma   . Gastritis     There are no active problems to display for this patient.   Past Surgical History:  Procedure Laterality Date  . APPENDECTOMY      OB History    No data available       Home Medications    Prior to Admission medications   Medication Sig Start Date End Date Taking? Authorizing Provider  albuterol (PROVENTIL HFA;VENTOLIN HFA) 108 (90 Base) MCG/ACT inhaler Inhale 1-2 puffs into the lungs every 6 (six) hours as needed for wheezing or shortness of breath. 11/25/16  Yes Long, Arlyss Repress, MD  albuterol (PROVENTIL) (2.5 MG/3ML) 0.083% nebulizer solution Take 3 mLs (2.5 mg total) by nebulization every 6 (six) hours as needed for wheezing or shortness of breath. 11/25/16  Yes Long, Arlyss Repress, MD  dicyclomine (BENTYL) 10 MG capsule Take 1 capsule (10 mg total) by mouth 3 (three) times daily as needed for spasms. 11/04/16  Yes Jerrald Doverspike, Ambrose Finland, MD  famotidine (PEPCID) 20 MG tablet Take 20 mg by mouth 3 (three) times daily.   Yes [provider]  sucralfate (CARAFATE) 1 g tablet Take 1 g by mouth 3 (three) times daily.   Yes [provider]  nitrofurantoin, macrocrystal-monohydrate, (MACROBID) 100 MG capsule  Take 1 capsule (100 mg total) by mouth 2 (two) times daily. 12/26/16   Stacie Knutzen, Ambrose Finland, MD  omeprazole (PRILOSEC) 20 MG capsule Take 1 capsule (20 mg total) by mouth daily. Patient not taking: Reported on 12/26/2016 11/04/16   Lochlyn Zullo, Ambrose Finland, MD  ondansetron (ZOFRAN ODT) 4 MG disintegrating tablet Take 1 tablet (4 mg total) by mouth every 8 (eight) hours as needed for nausea or vomiting. Patient not taking: Reported on 12/26/2016 11/25/16   Long, Arlyss Repress, MD    Family History History reviewed. No pertinent family history.  Social History Social History  Substance Use Topics  . Smoking status: Current Every Day Smoker  . Smokeless tobacco: Never Used  . Alcohol use No     Allergies   Ibuprofen; Penicillins; Tramadol; and Asa [aspirin]   Review of Systems Review of Systems  Genitourinary: Positive for dysuria.   All other systems reviewed and are negative except that which was mentioned in HPI   Physical Exam Updated Vital Signs BP 114/83 (BP Location: Left Arm)   Pulse 93   Temp 98.2 F (36.8 C) (Oral)   Resp 18   SpO2 100%   Physical Exam  Constitutional: She is oriented to person, place, and time. She appears well-developed and well-nourished. No distress.  HENT:  Head: Normocephalic and atraumatic.  Mouth/Throat: Oropharynx is clear and moist.  Moist mucous membranes  Eyes: Pupils are equal,  round, and reactive to light. Conjunctivae are normal.  Neck: Neck supple.  Cardiovascular: Normal rate, regular rhythm and normal heart sounds.   No murmur heard. Pulmonary/Chest: Effort normal and breath sounds normal.  Abdominal: Soft. Bowel sounds are normal. She exhibits no distension. There is no tenderness.  Musculoskeletal: She exhibits no edema.  Neurological: She is alert and oriented to person, place, and time.  Fluent speech  Skin: Skin is warm and dry.  Psychiatric: She has a normal mood and affect. Judgment normal.  Nursing note and vitals  reviewed.    ED Treatments / Results  Labs (all labs ordered are listed, but only abnormal results are displayed) Labs Reviewed  URINALYSIS, ROUTINE W REFLEX MICROSCOPIC - Abnormal; Notable for the following:       Result Value   APPearance CLOUDY (*)    Protein, ur 30 (*)    Leukocytes, UA LARGE (*)    Bacteria, UA RARE (*)    Squamous Epithelial / LPF 0-5 (*)    All other components within normal limits  URINE CULTURE  POC URINE PREG, ED    EKG  EKG Interpretation None       Radiology No results found.  Procedures Procedures (including critical care time)  Medications Ordered in ED Medications - No data to display   Initial Impression / Assessment and Plan / ED Course  I have reviewed the triage vital signs and the nursing notes.  Pertinent imaging results that were available during my care of the patient were reviewed by me and considered in my medical decision making (see chart for details).     Pt w/ UTI sx, UA c/w infection. No fevers, back pain to suggest pyelonephritis. Given serious reaction to PCN, will treat w/ macrobid. Reviewed return precautions including any worsening symptoms, or new symptoms such as fever, back pain, or vomiting. Patient voiced understanding and was discharged in satisfactory condition.  Final Clinical Impressions(s) / ED Diagnoses   Final diagnoses:  Acute cystitis without hematuria    New Prescriptions New Prescriptions   NITROFURANTOIN, MACROCRYSTAL-MONOHYDRATE, (MACROBID) 100 MG CAPSULE    Take 1 capsule (100 mg total) by mouth 2 (two) times daily.     Aldena Worm, Ambrose Finland, MD 12/26/16 [phone]

## 2016-12-26 NOTE — ED Notes (Signed)
Declined W/C at D/C and was escorted to lobby by RN. 

## 2016-12-26 NOTE — ED Triage Notes (Signed)
Pt reports having burning pain after she urinates, symptoms just started today. Denies vaginal discharge or itching. reports urinary frequency, lmp two months ago.

## 2016-12-29 LAB — URINE CULTURE: SPECIAL REQUESTS: NORMAL

## 2016-12-30 ENCOUNTER — Telehealth: Payer: Self-pay | Admitting: *Deleted

## 2016-12-30 NOTE — Telephone Encounter (Signed)
Post ED Visit - Positive Culture Follow-up  Culture report reviewed by antimicrobial stewardship pharmacist:   Enzo Bi, Pharm.D.  Celedonio Miyamoto, Pharm.D., BCPS AQ-ID  Garvin Fila, Pharm.D., BCPS  Georgina Pillion, Pharm.D., BCPS  [place], [address].D., BCPS, AAHIVP  Estella Husk, Pharm.D., BCPS, AAHIVP  Lysle Pearl, PharmD, BCPS  Casilda Carls, PharmD, BCPS  Pollyann Samples, PharmD, BCPS  Positive urine culture Treated with Nitrofurantoin Monohyd Macro, organism sensitive to the same and no further patient follow-up is required at this time.  Virl Axe [facility] 12/30/2016, 12:25 PM

## 2017-01-20 ENCOUNTER — Encounter (HOSPITAL_COMMUNITY): Payer: Self-pay

## 2017-01-20 ENCOUNTER — Emergency Department (HOSPITAL_COMMUNITY): Payer: Medicaid Other

## 2017-01-20 ENCOUNTER — Emergency Department (HOSPITAL_COMMUNITY)
Admission: EM | Admit: 2017-01-20 | Discharge: 2017-01-20 | Disposition: A | Discharge status: Home / Self Care | Payer: Medicaid Other | Attending: Emergency Medicine | Admitting: Emergency Medicine

## 2017-01-20 DIAGNOSIS — F17200 Nicotine dependence, unspecified, uncomplicated: Secondary | ICD-10-CM | POA: Diagnosis not present

## 2017-01-20 DIAGNOSIS — R0602 Shortness of breath: Secondary | ICD-10-CM | POA: Diagnosis present

## 2017-01-20 DIAGNOSIS — Z79899 Other long term (current) drug therapy: Secondary | ICD-10-CM | POA: Insufficient documentation

## 2017-01-20 DIAGNOSIS — J4521 Mild intermittent asthma with (acute) exacerbation: Secondary | ICD-10-CM | POA: Insufficient documentation

## 2017-01-20 MED ORDER — BENZONATATE 100 MG PO CAPS: 100.0000 mg | ORAL_CAPSULE | Freq: Three times a day (TID) | ORAL | 0 refills | Status: DC

## 2017-01-20 MED ORDER — IPRATROPIUM-ALBUTEROL 0.5-2.5 (3) MG/3ML IN SOLN
3.0000 mL | Freq: Once | RESPIRATORY_TRACT | Status: AC
  Administered 2017-01-20: 3 mL via RESPIRATORY_TRACT
  Filled 2017-01-20: qty 3

## 2017-01-20 NOTE — ED Notes (Signed)
Pt has hx of asthma. Showed two different types of inhalers that she has been using.  Denies fever.  States that she has had cough/cold x 3 days.  States chest hurts when she coughs.  Denies any radiating pain.

## 2017-01-20 NOTE — ED Triage Notes (Signed)
Pt states cough with chest tightness and SOB. She reports nasal congestion with productive cough. Has hx of asthma has been using home treatments. Lung sounds clear, no distress.

## 2017-01-20 NOTE — Discharge Instructions (Signed)
Please read attached information. If you experience any new or worsening signs or symptoms please return to the emergency room for evaluation. Please follow-up with your primary care provider or specialist as discussed. Please use medication prescribed only as directed and discontinue taking if you have any concerning signs or symptoms.   °

## 2017-01-20 NOTE — ED Provider Notes (Signed)
[facility]-EMERG[facility]Y [facility]PT Provider Note   CSN: [id number] Arrival date & time: 01/20/17  1653     History   Chief Complaint Chief Complaint  Patient presents with  . Shortness of Breath    HPI Shirley Mckinney is a 36 y.o. female.  HPI   36 year old female with history of asthma presents today with complaints of shortness of breath.  Patient notes over the last 3 days she has experienced nasal congestion, chest congestion and asthma exacerbation.  She notes using nebulizer at home, also notes using albuterol in her daily inhaled steroid without significant improvement.  Patient notes that she does not use her daily steroid as instructed.  She notes shortness of breath and wheezing.  She reports cough, no fever, no chest pain other than muscular pain with coughing.   Past Medical History:  Diagnosis Date  . Asthma   . Gastritis     There are no active problems to display for this patient.   Past Surgical History:  Procedure Laterality Date  . APPENDECTOMY      OB History    No data available       Home Medications    Prior to Admission medications   Medication Sig Start Date End Date Taking? Authorizing Provider  albuterol (PROVENTIL HFA;VENTOLIN HFA) 108 (90 Base) MCG/ACT inhaler Inhale 1-2 puffs into the lungs every 6 (six) hours as needed for wheezing or shortness of breath. 11/25/16   Long, Arlyss Repress, MD  albuterol (PROVENTIL) (2.5 MG/3ML) 0.083% nebulizer solution Take 3 mLs (2.5 mg total) by nebulization every 6 (six) hours as needed for wheezing or shortness of breath. 11/25/16   Long, Arlyss Repress, MD  benzonatate (TESSALON) 100 MG capsule Take 1 capsule (100 mg total) by mouth every 8 (eight) hours. 01/20/17   Jermery Caratachea, Tinnie Gens, PA-C  dicyclomine (BENTYL) 10 MG capsule Take 1 capsule (10 mg total) by mouth 3 (three) times daily as needed for spasms. 11/04/16   Little, Ambrose Finland, MD  famotidine (PEPCID) 20 MG tablet Take 20 mg by mouth 3 (three) times daily.    [provider]  nitrofurantoin, macrocrystal-monohydrate, (MACROBID) 100 MG capsule Take 1 capsule (100 mg total) by mouth 2 (two) times daily. 12/26/16   Little, Ambrose Finland, MD  omeprazole (PRILOSEC) 20 MG capsule Take 1 capsule (20 mg total) by mouth daily. Patient not taking: Reported on 12/26/2016 11/04/16   Little, Ambrose Finland, MD  ondansetron (ZOFRAN ODT) 4 MG disintegrating tablet Take 1 tablet (4 mg total) by mouth every 8 (eight) hours as needed for nausea or vomiting. Patient not taking: Reported on 12/26/2016 11/25/16   Long, Arlyss Repress, MD  sucralfate (CARAFATE) 1 g tablet Take 1 g by mouth 3 (three) times daily.    [provider]    Family History History reviewed. No pertinent family history.  Social History Social History  Substance Use Topics  . Smoking status: Current Every Day Smoker  . Smokeless tobacco: Never Used  . Alcohol use No     Allergies   Ibuprofen; Penicillins; Tramadol; and Asa [aspirin]   Review of Systems Review of Systems  All other systems reviewed and are negative.    Physical Exam Updated Vital Signs BP 110/70   Pulse 88   Temp 97.9 F (36.6 C) (Oral)   Resp 16   LMP 01/18/2017 (Exact Date)   SpO2 100%   Physical Exam  Constitutional: She is oriented to person, place, and time. She appears well-developed and well-nourished.  HENT:  Head: Normocephalic and atraumatic.  Eyes: Pupils are equal, round, and reactive to light. Conjunctivae are normal. Right eye exhibits no discharge. Left eye exhibits no discharge. No scleral icterus.  Neck: Normal range of motion. No JVD present. No tracheal deviation present.  Cardiovascular: Regular rhythm, normal heart sounds and intact distal pulses.  Exam reveals no gallop and no friction rub.   No murmur heard. Pulmonary/Chest: Effort normal. No stridor. No respiratory distress. She has wheezes. She has no rales. She exhibits no tenderness.  Minor bilateral expiratory wheeze     Neurological: She is alert and oriented to person, place, and time. Coordination normal.  Skin: Skin is warm.  Psychiatric: She has a normal mood and affect. Her behavior is normal. Judgment and thought content normal.  Nursing note and vitals reviewed.   ED Treatments / Results  Labs (all labs ordered are listed, but only abnormal results are displayed) Labs Reviewed - No data to display  EKG  EKG Interpretation None       Radiology Dg Chest 2 View  Result Date: 01/20/2017 CLINICAL DATA:  Cough with chest tightness in shortness-of-breath. EXAM: CHEST  2 VIEW COMPARISON:  11/22/2016 FINDINGS: The heart size and mediastinal contours are within normal limits. Both lungs are clear. The visualized skeletal structures are unremarkable. IMPRESSION: No active cardiopulmonary disease. Electronically Signed   By: Elberta Fortis M.D.   On: 01/20/2017 17:43    Procedures Procedures (including critical care time)  Medications Ordered in ED Medications  ipratropium-albuterol (DUONEB) 0.5-2.5 (3) MG/3ML nebulizer solution 3 mL (3 mLs Nebulization Given 01/20/17 1830)     Initial Impression / Assessment and Plan / ED Course  I have reviewed the triage vital signs and the nursing notes.  Pertinent labs & imaging results that were available during my care of the patient were reviewed by me and considered in my medical decision making (see chart for details).      Final Clinical Impressions(s) / ED Diagnoses   Final diagnoses:  Intermittent asthma with acute exacerbation, unspecified asthma severity    36 year old female presents today with likely asthma exacerbation.  This is likely secondary to viral URI.  No signs of bacterial source.  Patient with minor wheeze on exam.  She was given a breathing treatment which resolved her wheeze.  No signs of respiratory distress, no signs of acute bacterial infection.  I do not feel steroids are necessary in this patient at this time.  Patient  has been here numerous times utilizing the emergency room.  I discussed the need for her to follow-up with the primary care, return to emergency room for any acute changes.  Patient verbalized understanding and agreement to today's plan had no further questions or concerns at the time discharge.  New Prescriptions Discharge Medication List as of 01/20/2017  8:21 PM    START taking these medications   Details  benzonatate (TESSALON) 100 MG capsule Take 1 capsule (100 mg total) by mouth every 8 (eight) hours., Starting Thu 01/20/2017, Print         Troye Hiemstra, [place], PA-C 01/20/17 2045    Derwood Kaplan, MD 01/21/17 [phone]

## 2017-01-24 ENCOUNTER — Encounter (HOSPITAL_COMMUNITY): Payer: Self-pay | Admitting: Emergency Medicine

## 2017-01-24 ENCOUNTER — Emergency Department (HOSPITAL_COMMUNITY)
Admission: EM | Admit: 2017-01-24 | Discharge: 2017-01-24 | Discharge status: Against Medical Advice | Payer: Medicaid Other | Attending: Emergency Medicine | Admitting: Emergency Medicine

## 2017-01-24 DIAGNOSIS — R109 Unspecified abdominal pain: Secondary | ICD-10-CM | POA: Diagnosis not present

## 2017-01-24 DIAGNOSIS — Z5321 Procedure and treatment not carried out due to patient leaving prior to being seen by health care provider: Secondary | ICD-10-CM | POA: Insufficient documentation

## 2017-01-24 LAB — COMPREHENSIVE METABOLIC PANEL
ALBUMIN: 3.4 g/dL — AB (ref 3.5–5.0)
ALT: 11 U/L — ABNORMAL LOW (ref 14–54)
ANION GAP: 9 (ref 5–15)
AST: 19 U/L (ref 15–41)
Alkaline Phosphatase: 70 U/L (ref 38–126)
BILIRUBIN TOTAL: 0.1 mg/dL — AB (ref 0.3–1.2)
BUN: 5 mg/dL — ABNORMAL LOW (ref 6–20)
CHLORIDE: 107 mmol/L (ref 101–111)
CO2: 20 mmol/L — AB (ref 22–32)
Calcium: 8.7 mg/dL — ABNORMAL LOW (ref 8.9–10.3)
Creatinine, Ser: 0.63 mg/dL (ref 0.44–1.00)
GFR calc Af Amer: >60 mL/min (ref >60)
GFR calc non Af Amer: >60 mL/min (ref >60)
GLUCOSE: 109 mg/dL — AB (ref 65–99)
POTASSIUM: 3.5 mmol/L (ref 3.5–5.1)
SODIUM: 136 mmol/L (ref 135–145)
TOTAL PROTEIN: 6.3 g/dL — AB (ref 6.5–8.1)

## 2017-01-24 LAB — URINALYSIS, ROUTINE W REFLEX MICROSCOPIC
BILIRUBIN URINE: NEGATIVE
Glucose, UA: NEGATIVE mg/dL
Hgb urine dipstick: NEGATIVE
Ketones, ur: NEGATIVE mg/dL
Leukocytes, UA: NEGATIVE
NITRITE: NEGATIVE
PH: 5 (ref 5.0–8.0)
Protein, ur: NEGATIVE mg/dL
SPECIFIC GRAVITY, URINE: 1.035 — AB (ref 1.005–1.030)

## 2017-01-24 LAB — CBC
HEMATOCRIT: 34.5 % — AB (ref 36.0–46.0)
HEMOGLOBIN: 11.7 g/dL — AB (ref 12.0–15.0)
MCH: 30.8 pg (ref 26.0–34.0)
MCHC: 33.9 g/dL (ref 30.0–36.0)
MCV: 90.8 fL (ref 78.0–100.0)
Platelets: 416 10*3/uL — ABNORMAL HIGH (ref 150–400)
RBC: 3.8 MIL/uL — ABNORMAL LOW (ref 3.87–5.11)
RDW: 13.8 % (ref 11.5–15.5)
WBC: 15.2 10*3/uL — ABNORMAL HIGH (ref 4.0–10.5)

## 2017-01-24 LAB — LIPASE, BLOOD: LIPASE: 37 U/L (ref 11–51)

## 2017-01-24 MED ORDER — ONDANSETRON 4 MG PO TBDP
4.0000 mg | ORAL_TABLET | Freq: Once | ORAL | Status: AC | PRN
Start: 2017-01-24 — End: 2017-01-24
  Administered 2017-01-24: 4 mg via ORAL

## 2017-01-24 MED ORDER — ONDANSETRON 4 MG PO TBDP
ORAL_TABLET | ORAL | Status: AC
  Filled 2017-01-24: qty 1

## 2017-01-24 NOTE — ED Notes (Signed)
Pt states she is no longer willing to wait. Apologized for dealys. LWBS after triage.

## 2017-01-24 NOTE — ED Triage Notes (Signed)
Pt presents to ED for assessment of abdominal pain, starting yesterday.  Hx of gastritis, states it feels the same.  C/o nausea and emesis (x5 today) and diarrhea (loose, not liquid).  Pt denies changes in urination.

## 2017-01-25 ENCOUNTER — Encounter (HOSPITAL_COMMUNITY): Payer: Self-pay | Admitting: *Deleted

## 2017-01-25 ENCOUNTER — Emergency Department (HOSPITAL_COMMUNITY)
Admission: EM | Admit: 2017-01-25 | Discharge: 2017-01-25 | Disposition: A | Discharge status: Home / Self Care | Payer: Medicaid Other | Attending: Emergency Medicine | Admitting: Emergency Medicine

## 2017-01-25 ENCOUNTER — Emergency Department (HOSPITAL_COMMUNITY): Payer: Medicaid Other

## 2017-01-25 DIAGNOSIS — F17210 Nicotine dependence, cigarettes, uncomplicated: Secondary | ICD-10-CM | POA: Insufficient documentation

## 2017-01-25 DIAGNOSIS — R1013 Epigastric pain: Secondary | ICD-10-CM | POA: Diagnosis present

## 2017-01-25 DIAGNOSIS — Z79899 Other long term (current) drug therapy: Secondary | ICD-10-CM | POA: Insufficient documentation

## 2017-01-25 DIAGNOSIS — J45909 Unspecified asthma, uncomplicated: Secondary | ICD-10-CM | POA: Insufficient documentation

## 2017-01-25 DIAGNOSIS — K2950 Unspecified chronic gastritis without bleeding: Secondary | ICD-10-CM | POA: Insufficient documentation

## 2017-01-25 LAB — CBC
HEMATOCRIT: 34.5 % — AB (ref 36.0–46.0)
Hemoglobin: 11.4 g/dL — ABNORMAL LOW (ref 12.0–15.0)
MCH: 29.8 pg (ref 26.0–34.0)
MCHC: 33 g/dL (ref 30.0–36.0)
MCV: 90.1 fL (ref 78.0–100.0)
Platelets: 453 10*3/uL — ABNORMAL HIGH (ref 150–400)
RBC: 3.83 MIL/uL — AB (ref 3.87–5.11)
RDW: 13.9 % (ref 11.5–15.5)
WBC: 8.7 10*3/uL (ref 4.0–10.5)

## 2017-01-25 LAB — COMPREHENSIVE METABOLIC PANEL
ALT: 10 U/L — AB (ref 14–54)
AST: 17 U/L (ref 15–41)
Albumin: 3.4 g/dL — ABNORMAL LOW (ref 3.5–5.0)
Alkaline Phosphatase: 73 U/L (ref 38–126)
Anion gap: 7 (ref 5–15)
BUN: 6 mg/dL (ref 6–20)
CHLORIDE: 106 mmol/L (ref 101–111)
CO2: 23 mmol/L (ref 22–32)
CREATININE: 0.56 mg/dL (ref 0.44–1.00)
Calcium: 8.7 mg/dL — ABNORMAL LOW (ref 8.9–10.3)
Glucose, Bld: 94 mg/dL (ref 65–99)
POTASSIUM: 3.6 mmol/L (ref 3.5–5.1)
SODIUM: 136 mmol/L (ref 135–145)
Total Bilirubin: 0.3 mg/dL (ref 0.3–1.2)
Total Protein: 6.4 g/dL — ABNORMAL LOW (ref 6.5–8.1)

## 2017-01-25 LAB — LIPASE, BLOOD: LIPASE: 35 U/L (ref 11–51)

## 2017-01-25 MED ORDER — FAMOTIDINE 20 MG PO TABS: 20.0000 mg | ORAL_TABLET | Freq: Three times a day (TID) | ORAL | 0 refills | Status: DC

## 2017-01-25 MED ORDER — SODIUM CHLORIDE 0.9 % IV BOLUS (SEPSIS)
1000.0000 mL | Freq: Once | INTRAVENOUS | Status: AC
  Administered 2017-01-25: 1000 mL via INTRAVENOUS

## 2017-01-25 MED ORDER — DICYCLOMINE HCL 10 MG PO CAPS: 10.0000 mg | ORAL_CAPSULE | Freq: Three times a day (TID) | ORAL | 0 refills | Status: DC | PRN

## 2017-01-25 MED ORDER — OXYCODONE-ACETAMINOPHEN 5-325 MG PO TABS
ORAL_TABLET | ORAL | Status: AC
  Filled 2017-01-25: qty 1

## 2017-01-25 MED ORDER — SUCRALFATE 1 GM/10ML PO SUSP: 1.0000 g | Freq: Three times a day (TID) | ORAL | 0 refills | Status: DC

## 2017-01-25 MED ORDER — HYDROCODONE-ACETAMINOPHEN 5-325 MG PO TABS
1.0000 | ORAL_TABLET | Freq: Once | ORAL | Status: AC
  Administered 2017-01-25: 1 via ORAL
  Filled 2017-01-25: qty 1

## 2017-01-25 MED ORDER — DIPHENHYDRAMINE HCL 50 MG/ML IJ SOLN
12.5000 mg | Freq: Once | INTRAMUSCULAR | Status: AC
  Administered 2017-01-25: 12.5 mg via INTRAVENOUS
  Filled 2017-01-25: qty 1

## 2017-01-25 MED ORDER — ONDANSETRON 4 MG PO TBDP: 4.0000 mg | ORAL_TABLET | Freq: Three times a day (TID) | ORAL | 0 refills | Status: DC | PRN

## 2017-01-25 MED ORDER — GI COCKTAIL ~~LOC~~
30.0000 mL | Freq: Once | ORAL | Status: AC
  Administered 2017-01-25: 30 mL via ORAL
  Filled 2017-01-25: qty 30

## 2017-01-25 MED ORDER — DICYCLOMINE HCL 10 MG/ML IM SOLN
20.0000 mg | Freq: Once | INTRAMUSCULAR | Status: AC
  Administered 2017-01-25: 20 mg via INTRAMUSCULAR
  Filled 2017-01-25: qty 2

## 2017-01-25 MED ORDER — OXYCODONE-ACETAMINOPHEN 5-325 MG PO TABS: 1.0000 | ORAL_TABLET | ORAL | Status: DC | PRN

## 2017-01-25 MED ORDER — METOCLOPRAMIDE HCL 5 MG/ML IJ SOLN
10.0000 mg | Freq: Once | INTRAMUSCULAR | Status: AC
  Administered 2017-01-25: 10 mg via INTRAVENOUS
  Filled 2017-01-25: qty 2

## 2017-01-25 MED ORDER — PANTOPRAZOLE SODIUM 40 MG IV SOLR
40.0000 mg | Freq: Once | INTRAVENOUS | Status: AC
  Administered 2017-01-25: 40 mg via INTRAVENOUS
  Filled 2017-01-25: qty 40

## 2017-01-25 MED ORDER — OMEPRAZOLE 40 MG PO CPDR: 40.0000 mg | DELAYED_RELEASE_CAPSULE | Freq: Every day | ORAL | 0 refills | Status: DC

## 2017-01-25 MED ORDER — SUCRALFATE 1 G PO TABS
1.0000 g | ORAL_TABLET | Freq: Once | ORAL | Status: AC
  Administered 2017-01-25: 1 g via ORAL
  Filled 2017-01-25: qty 1

## 2017-01-25 MED ORDER — FAMOTIDINE IN NACL 20-0.9 MG/50ML-% IV SOLN
20.0000 mg | Freq: Once | INTRAVENOUS | Status: AC
  Administered 2017-01-25: 20 mg via INTRAVENOUS
  Filled 2017-01-25: qty 50

## 2017-01-25 NOTE — Discharge Instructions (Signed)
Your workup and imaging has been reassuring. This is likely a gastritis. Have given you a diet to use. Avoid NSAIDs. It is very important that she follow-up with her GI doctor. Have given you a referral.  Your abdominal pain is likely from gastritis, reflux or a stomach ulcer. You will need to take the prescribed proton pump inhibitor as directed, and avoid spicy/fatty/acidic foods. Avoid laying down flat within 30 minutes of eating. Avoid NSAIDs like ibuprofen or Aleve on an empty stomach. Use zofran as needed for nausea. Follow up with the gastroenterologist (GI doctor) listed for ongoing evaluation of your abdominal pain. Return to the ER for new or worsening symptoms, any additional concers.   SEEK IMMEDIATE MEDICAL ATTENTION IF YOU DEVELOP ANY OF THE FOLLOWING SYMPTOMS: The pain does not go away or becomes severe.  A temperature above 101 develops.  Repeated vomiting occurs (multiple episodes).  Blood is being passed in stools or vomit (bright red or black tarry stools).  Return also if you develop chest pain, difficulty breathing, dizziness or fainting

## 2017-01-25 NOTE — ED Triage Notes (Signed)
To ED for eval of abd pain due to gastritis. Pt states she has a hx. And has meds for same but is out. Pt tearful. No vomiting. No diarrhea.

## 2017-01-25 NOTE — ED Provider Notes (Signed)
[facility] EMERGENCY DEPARTMENT Provider Note   CSN: [id number] Arrival date & time: 01/25/17  1520     History   Chief Complaint Chief Complaint  Patient presents with  . Abdominal Pain    HPI Shirley Mckinney is a 36 y.o. female.  HPI25 year old African-American female past medical history significant for asthma and gastritis presents to the ED with complaints of epigastric abdominal pain. Patient has been seen several times in the ED of the past 6 months for same complaint. Doesn't history of alcohol gastritis. States over the past 3 days her symptoms have been gradually worsening. States the pain is constant and burning sensation in her epigastric region. She has tried her Prilosec and Bentyl at home with little relief. Patient reports nausea and 1 episode of non bloody non bilious emesis today. Does report an episode of loose stool today. Denies any associated melena or hematochezia.patient states this is consistent with her previous history of gastritis.Recently moved from [place] earlier this month, states that she was unable to fill her Percocet here for pain control. States her pain is worse with eating. Denies any abdominal surgeries. States that usually when she had this in [place] that she would get morphine, and it would improve her symptoms. Denies any chronic NSAID use. States that she is allergic to ibuprofen, Motrin, tramadol. Denies any recent travel. Denies any recent alcohol use.  Pt denies any fever, chill, ha, vision changes, lightheadedness, dizziness, congestion, neck pain, cp, sob, cough,  urinary symptoms, melena, hematochezia, lower extremity paresthesias.   Past Medical History:  Diagnosis Date  . Asthma   . Gastritis     There are no active problems to display for this patient.   Past Surgical History:  Procedure Laterality Date  . APPENDECTOMY      OB History    No data available       Home Medications    Prior to  Admission medications   Medication Sig Start Date End Date Taking? Authorizing Provider  albuterol (PROVENTIL HFA;VENTOLIN HFA) 108 (90 Base) MCG/ACT inhaler Inhale 1-2 puffs into the lungs every 6 (six) hours as needed for wheezing or shortness of breath. 11/25/16   Long, Arlyss Repress, MD  albuterol (PROVENTIL) (2.5 MG/3ML) 0.083% nebulizer solution Take 3 mLs (2.5 mg total) by nebulization every 6 (six) hours as needed for wheezing or shortness of breath. 11/25/16   Long, Arlyss Repress, MD  benzonatate (TESSALON) 100 MG capsule Take 1 capsule (100 mg total) by mouth every 8 (eight) hours. 01/20/17   Hedges, Tinnie Gens, PA-C  dicyclomine (BENTYL) 10 MG capsule Take 1 capsule (10 mg total) by mouth 3 (three) times daily as needed for spasms. 01/25/17   Rise Mu, PA-C  famotidine (PEPCID) 20 MG tablet Take 1 tablet (20 mg total) by mouth 3 (three) times daily. 01/25/17   Rise Mu, PA-C  nitrofurantoin, macrocrystal-monohydrate, (MACROBID) 100 MG capsule Take 1 capsule (100 mg total) by mouth 2 (two) times daily. 12/26/16   Little, Ambrose Finland, MD  omeprazole (PRILOSEC) 40 MG capsule Take 1 capsule (40 mg total) by mouth daily. 01/25/17   Demetrios Loll T, PA-C  ondansetron (ZOFRAN ODT) 4 MG disintegrating tablet Take 1 tablet (4 mg total) by mouth every 8 (eight) hours as needed for nausea or vomiting. 01/25/17   Rise Mu, PA-C  sucralfate (CARAFATE) 1 GM/10ML suspension Take 10 mLs (1 g total) by mouth 4 (four) times daily -  with meals and at  bedtime. 01/25/17   Rise Mu, PA-C    Family History No family history on file.  Social History Social History  Substance Use Topics  . Smoking status: Current Every Day Smoker  . Smokeless tobacco: Never Used  . Alcohol use No     Allergies   Ibuprofen; Penicillins; Tramadol; and Asa [aspirin]   Review of Systems Review of Systems  Constitutional: Negative for chills and fever.  HENT: Negative for congestion.     Eyes: Negative for visual disturbance.  Respiratory: Negative for cough and shortness of breath.   Cardiovascular: Negative for chest pain.  Gastrointestinal: Positive for abdominal pain, diarrhea, nausea and vomiting. Negative for blood in stool and constipation.  Genitourinary: Negative for dysuria, flank pain, frequency, hematuria, urgency, vaginal bleeding and vaginal discharge.  Musculoskeletal: Negative for arthralgias and myalgias.  Skin: Negative for rash.  Neurological: Negative for dizziness, syncope, weakness, light-headedness, numbness and headaches.  Psychiatric/Behavioral: Negative for sleep disturbance. The patient is not nervous/anxious.      Physical Exam Updated Vital Signs BP 105/83 (BP Location: Right Arm)   Pulse 82   Temp 98.3 F (36.8 C) (Oral)   Resp 17   LMP 01/18/2017 (Exact Date)   SpO2 100%   Physical Exam  Constitutional: She is oriented to person, place, and time. She appears well-developed and well-nourished.  Non-toxic appearance. No distress.  Patient is tearful and anxious in the room.  HENT:  Head: Normocephalic and atraumatic.  Nose: Nose normal.  Mouth/Throat: Oropharynx is clear and moist.  Mucous membranes moist.  Eyes: Pupils are equal, round, and reactive to light. Conjunctivae are normal. Right eye exhibits no discharge. Left eye exhibits no discharge.  Neck: Normal range of motion. Neck supple.  Cardiovascular: Normal rate, regular rhythm, normal heart sounds and intact distal pulses.   Pulmonary/Chest: Effort normal and breath sounds normal. No respiratory distress. She exhibits no tenderness.  Abdominal: Soft. Fluid wave: mild. Bowel sounds are increased. There is tenderness in the epigastric area. There is no rigidity, no rebound, no guarding, no CVA tenderness, no tenderness at McBurney's point and negative Murphy's sign.  While distracted with conversation, no focal abdominal tenderness although patient points to midepigastrium.    Musculoskeletal: Normal range of motion. She exhibits no tenderness.  Lymphadenopathy:    She has no cervical adenopathy.  Neurological: She is alert and oriented to person, place, and time.  Skin: Skin is warm and dry. Capillary refill takes less than 2 seconds.  Psychiatric: Her behavior is normal. Judgment and thought content normal.  Nursing note and vitals reviewed.    ED Treatments / Results  Labs (all labs ordered are listed, but only abnormal results are displayed) Labs Reviewed  COMPREHENSIVE METABOLIC PANEL - Abnormal; Notable for the following:       Result Value   Calcium 8.7 (*)    Total Protein 6.4 (*)    Albumin 3.4 (*)    ALT 10 (*)    All other components within normal limits  CBC - Abnormal; Notable for the following:    RBC 3.83 (*)    Hemoglobin 11.4 (*)    HCT 34.5 (*)    Platelets 453 (*)    All other components within normal limits  LIPASE, BLOOD    EKG  EKG Interpretation None       Radiology US Abdomen Complete  Result Date: 01/25/2017 CLINICAL DATA:  Three-day history of upper abdominal/epigastric pain EXAM: ABDOMEN ULTRASOUND COMPLETE COMPARISON:  CT abdomen and  pelvis November 22, 2016 FINDINGS: Gallbladder: No gallstones or wall thickening visualized. There is no pericholecystic fluid. No sonographic Murphy sign noted by sonographer. Common bile duct: Diameter: 3 mm. No intrahepatic, common hepatic, or common bile duct dilatation. Liver: No focal lesion identified. Within normal limits in parenchymal echogenicity. Portal vein is patent on color Doppler imaging with normal direction of blood flow towards the liver. IVC: No abnormality visualized. Pancreas: No pancreatic mass or inflammatory focus. Spleen: Size and appearance within normal limits. Right Kidney: Length: 10.0 cm. Echogenicity within normal limits. No mass or hydronephrosis visualized. Left Kidney: Length: 10.1 cm. Echogenicity within normal limits. No mass or hydronephrosis  visualized. Abdominal aorta: No aneurysm visualized. Other findings: No demonstrable ascites. IMPRESSION: Study within normal limits. Electronically Signed   By: Bretta Bang III M.D.   On: 01/25/2017 19:06    Procedures Procedures (including critical care time)  Medications Ordered in ED Medications  sodium chloride 0.9 % bolus 1,000 mL (0 mLs Intravenous Stopped 01/25/17 1854)  famotidine (PEPCID) IVPB 20 mg premix (0 mg Intravenous Stopped 01/25/17 1815)  pantoprazole (PROTONIX) injection 40 mg (40 mg Intravenous Given 01/25/17 1731)  gi cocktail (Maalox,Lidocaine,Donnatal) (30 mLs Oral Given 01/25/17 1729)  metoCLOPramide (REGLAN) injection 10 mg (10 mg Intravenous Given 01/25/17 1731)  diphenhydrAMINE (BENADRYL) injection 12.5 mg (12.5 mg Intravenous Given 01/25/17 1731)  dicyclomine (BENTYL) injection 20 mg (20 mg Intramuscular Given 01/25/17 1731)  sucralfate (CARAFATE) tablet 1 g (1 g Oral Given 01/25/17 1729)  HYDROcodone-acetaminophen (NORCO/VICODIN) 5-325 MG per tablet 1 tablet (1 tablet Oral Given 01/25/17 1928)     Initial Impression / Assessment and Plan / ED Course  I have reviewed the triage vital signs and the nursing notes.  Pertinent labs & imaging results that were available during my care of the patient were reviewed by me and considered in my medical decision making (see chart for details).     Patient presents to the ED with complaints of ongoing upper abdominal pain. Patient with history of gastritis and states this feels similar. Reports associated nausea, emesis, loose stools. Denies any associated urinary symptoms, fever. Patient has been seen several times in the past 6 months for same. She recently moved from [place] where she was seen several times for same as well. Patient has failed to  follow-up with a GI doctor. She has been using her Prilosec and Bentyl at home with little relief. Patient is requesting morphine which states "this usually helps her  pain". Discussed with patientthat we do not use narcotics to treat this type of pain.  Patient overall is well-appearing and nontoxic. Vital signs are reassuring. Patient is afebrile. No tachycardia or hypotension noted.  The patient does have some mild epigastric abdominal tenderness however when distracted no tenderness noted. No signs of peritonitis. Patient denies any vaginal bleeding, discharge or pelvic pain to suggest GYN pathology.  Laboratory is very reassuring. Normal lipase low suspicion for pancreatitis. No leukocytosis. Kidney function is normal.no elevation of liver enzymes. Electrolytes are normal and doubt dehydration.mild anemia noted and stable at baseline.  Ultrasound was ordered that showed no acute abnormalities. Patient was given Bentyl, Carafate, Protonix, Pepcid, Reglan, fluids. On reassessment patient was sleeping in the room. After she returned from ultrasound I asked patient how she was feeling she states that the Benadryl just made her sleepy but the medicine only slightly improved her pain. Patient is requesting further narcotic pain medicine. Continue to tell patient that this is not appropriate for treatment  of this type of pain. She was given a Percocet in triage. Will be given additional hydrocodone at this time. Patient is requesting crackers and water. Did provide to patient and she was able tolerate. She asked the nurse for several additional packs of crackers I did instruct patient to avoid this significant amount of solid food intake as liquid diet would be appropriate for 24 hours for gastritis.  Pt did have abd ct in 2 months ago for same symptoms and this did not show any acute pathology. Pt states that she recenlty ate pizza which may have made her symptoms worse.  Low suspicion for cholecystitis, pancreatitis, obstruction. Pain is not out of proportion to exam. Pt has no sign risk factors for mesenteric ischemia.   Have refilled patient's medications. I  encouraged that is very important for her to follow up with the gastrointestinal doctor. Repeat abdominal exam shows no signs of peritonitis. Patient is able to cannulate with normal gait. Vital signs remained very reassuring.  Pt is hemodynamically stable, in NAD, & able to ambulate in the ED. Evaluation does not show pathology that would require ongoing emergent intervention or inpatient treatment. I explained the diagnosis to the patient. Pain has been managed & has no complaints prior to dc. Pt is comfortable with above plan and is stable for discharge at this time. All questions were answered prior to disposition. Strict return precautions for f/u to the ED were discussed. Encouraged follow up with PCP.   Final Clinical Impressions(s) / ED Diagnoses   Final diagnoses:  Chronic gastritis without bleeding, unspecified gastritis type    New Prescriptions Discharge Medication List as of 01/25/2017  7:30 PM    START taking these medications   Details  sucralfate (CARAFATE) 1 GM/10ML suspension Take 10 mLs (1 g total) by mouth 4 (four) times daily -  with meals and at bedtime., Starting Tue 01/25/2017, Print         Rise Mu, PA-C 01/25/17 2236    Rise Mu, PA-C 01/25/17 2259    Linwood Dibbles, MD 01/25/17 [phone]

## 2017-01-25 NOTE — ED Notes (Signed)
Patient transported to Ultrasound 

## 2017-01-26 ENCOUNTER — Encounter (HOSPITAL_COMMUNITY): Payer: Self-pay | Admitting: Emergency Medicine

## 2017-01-26 ENCOUNTER — Emergency Department (HOSPITAL_COMMUNITY)
Admission: EM | Admit: 2017-01-26 | Discharge: 2017-01-27 | Discharge status: Against Medical Advice | Payer: Medicaid - Out of State | Attending: Emergency Medicine | Admitting: Emergency Medicine

## 2017-01-26 DIAGNOSIS — J45909 Unspecified asthma, uncomplicated: Secondary | ICD-10-CM | POA: Insufficient documentation

## 2017-01-26 DIAGNOSIS — F17200 Nicotine dependence, unspecified, uncomplicated: Secondary | ICD-10-CM | POA: Diagnosis not present

## 2017-01-26 DIAGNOSIS — Z5320 Procedure and treatment not carried out because of patient's decision for unspecified reasons: Secondary | ICD-10-CM | POA: Insufficient documentation

## 2017-01-26 DIAGNOSIS — R1084 Generalized abdominal pain: Secondary | ICD-10-CM | POA: Diagnosis present

## 2017-01-26 DIAGNOSIS — R1013 Epigastric pain: Secondary | ICD-10-CM | POA: Diagnosis not present

## 2017-01-26 LAB — COMPREHENSIVE METABOLIC PANEL
ALBUMIN: 3.5 g/dL (ref 3.5–5.0)
ALT: 11 U/L — ABNORMAL LOW (ref 14–54)
ANION GAP: 7 (ref 5–15)
AST: 23 U/L (ref 15–41)
Alkaline Phosphatase: 68 U/L (ref 38–126)
BUN: 6 mg/dL (ref 6–20)
CHLORIDE: 106 mmol/L (ref 101–111)
CO2: 23 mmol/L (ref 22–32)
Calcium: 8.8 mg/dL — ABNORMAL LOW (ref 8.9–10.3)
Creatinine, Ser: 0.61 mg/dL (ref 0.44–1.00)
GFR calc Af Amer: >60 mL/min (ref >60)
Glucose, Bld: 90 mg/dL (ref 65–99)
POTASSIUM: 3.3 mmol/L — AB (ref 3.5–5.1)
Sodium: 136 mmol/L (ref 135–145)
Total Bilirubin: 0.3 mg/dL (ref 0.3–1.2)
Total Protein: 6.4 g/dL — ABNORMAL LOW (ref 6.5–8.1)

## 2017-01-26 LAB — URINALYSIS, ROUTINE W REFLEX MICROSCOPIC
Bilirubin Urine: NEGATIVE
GLUCOSE, UA: NEGATIVE mg/dL
Hgb urine dipstick: NEGATIVE
KETONES UR: NEGATIVE mg/dL
LEUKOCYTES UA: NEGATIVE
Nitrite: NEGATIVE
PH: 6 (ref 5.0–8.0)
Protein, ur: NEGATIVE mg/dL
SPECIFIC GRAVITY, URINE: 1.011 (ref 1.005–1.030)

## 2017-01-26 LAB — CBC
HEMATOCRIT: 33.8 % — AB (ref 36.0–46.0)
HEMOGLOBIN: 11.3 g/dL — AB (ref 12.0–15.0)
MCH: 30.1 pg (ref 26.0–34.0)
MCHC: 33.4 g/dL (ref 30.0–36.0)
MCV: 89.9 fL (ref 78.0–100.0)
Platelets: 437 10*3/uL — ABNORMAL HIGH (ref 150–400)
RBC: 3.76 MIL/uL — ABNORMAL LOW (ref 3.87–5.11)
RDW: 13.9 % (ref 11.5–15.5)
WBC: 13 10*3/uL — AB (ref 4.0–10.5)

## 2017-01-26 LAB — LIPASE, BLOOD: LIPASE: 25 U/L (ref 11–51)

## 2017-01-26 MED ORDER — GI COCKTAIL ~~LOC~~
30.0000 mL | Freq: Once | ORAL | Status: AC
  Administered 2017-01-27: 30 mL via ORAL
  Filled 2017-01-26: qty 30

## 2017-01-26 MED ORDER — DICYCLOMINE HCL 10 MG PO CAPS
10.0000 mg | ORAL_CAPSULE | Freq: Once | ORAL | Status: AC
  Administered 2017-01-27: 10 mg via ORAL
  Filled 2017-01-26: qty 1

## 2017-01-26 NOTE — ED Triage Notes (Signed)
Pt to ER for persistent abdominal pain onset 3-4 days ago, states hx of gastritis. States medications given yesterday aren't helping and has began to have diarrhea. VSS. Tearful at triage.

## 2017-01-26 NOTE — ED Notes (Signed)
Pt complains of abd pain. Pt states last bowel movement today and was diarhea.

## 2017-01-26 NOTE — ED Provider Notes (Signed)
Patient seen/examined in the Emergency Department in conjunction with Midlevel Provider Shrosbree Patient reports diffuse abdominal pain.  This is chronic pain Exam : awake/alert, tearful, anxious Plan: advised need for outpatient management of her chronic pain and we would be unable to provide narcotics     Zadie RhineWickline, Jynesis Nakamura, MD 01/26/17 2352

## 2017-01-26 NOTE — ED Provider Notes (Signed)
[facility] EMERGENCY DEPARTMENT Provider Note   CSN: [id number] Arrival date & time: 01/26/17  1553     History   Chief Complaint Chief Complaint  Patient presents with  . Abdominal Pain    HPI Shirley Mckinney is a 36 y.o. female.  HPI   Shirley Mckinney is a 36 year old female with a history of asthma and chronic gastritis who presents the emergency department with complaint of "gastritis flare." She has been seen in the ED for this complaint multiple times in the past several months. She was last seen in the ED yesterday with similar symptoms. Work up included labs and abdominal ultrasound which was normal. She had a CT abdomen/pelvis in August which did not show acute abnormality. Today patient states that her pain has not improved since she was seen in the ER yesterday. Reports 10/10 "burning, sharp" epigastric pain which is constant. It has been ongoing for the past 3 days. She states that she has had similar flares for the past several years. She has associated diarrhea, endorsing 3 episodes today. She denies any new or changing symptoms from yesterday other than the fact that the pain is ongoing. She states that she has tried Bentyl, Carafate, Protonix, Pepcid without significant relief. She is specifically requesting morphine, Percocet or Norco. States that she has been in the ER for 8 hours and "I deserve some real pain medicine." States that this is the only medicine that will improve her pain and has been given narcotics in previous ED visits. Her pain is generally worsened by eating. She denies alcohol use. States that she does not use NSAIDs as she is allergic to ibuprofen, tramadol, Aleve with reaction of hives and throat closing. She has history of appendectomy and 3 C-sections. She denies fever, nausea/vomiting, dysuria, frequency, flank pain, hematochezia, melena, vaginal discharge, chest pain, shortness of breath. Is able to drink PO fluids.   Past Medical  History:  Diagnosis Date  . Asthma   . Gastritis     There are no active problems to display for this patient.   Past Surgical History:  Procedure Laterality Date  . APPENDECTOMY      OB History    No data available       Home Medications    Prior to Admission medications   Medication Sig Start Date End Date Taking? Authorizing Provider  albuterol (PROVENTIL HFA;VENTOLIN HFA) 108 (90 Base) MCG/ACT inhaler Inhale 1-2 puffs into the lungs every 6 (six) hours as needed for wheezing or shortness of breath. 11/25/16  Yes Long, Arlyss Repress, MD  albuterol (PROVENTIL) (2.5 MG/3ML) 0.083% nebulizer solution Take 3 mLs (2.5 mg total) by nebulization every 6 (six) hours as needed for wheezing or shortness of breath. 11/25/16  Yes Long, Arlyss Repress, MD  dicyclomine (BENTYL) 10 MG capsule Take 1 capsule (10 mg total) by mouth 3 (three) times daily as needed for spasms. 01/25/17  Yes Leaphart, Lynann Beaver, PA-C  famotidine (PEPCID) 20 MG tablet Take 1 tablet (20 mg total) by mouth 3 (three) times daily. 01/25/17  Yes Leaphart, Iantha Fallen T, PA-C  ondansetron (ZOFRAN ODT) 4 MG disintegrating tablet Take 1 tablet (4 mg total) by mouth every 8 (eight) hours as needed for nausea or vomiting. 01/25/17  Yes Leaphart, Lynann Beaver, PA-C  sucralfate (CARAFATE) 1 GM/10ML suspension Take 10 mLs (1 g total) by mouth 4 (four) times daily -  with meals and at bedtime. 01/25/17  Yes Rise Mu, PA-C  benzonatate (TESSALON) 100  MG capsule Take 1 capsule (100 mg total) by mouth every 8 (eight) hours. 01/20/17   Hedges, Tinnie GensJeffrey, PA-C  nitrofurantoin, macrocrystal-monohydrate, (MACROBID) 100 MG capsule Take 1 capsule (100 mg total) by mouth 2 (two) times daily. Patient not taking: Reported on 01/26/2017 12/26/16   Little, Ambrose Finlandachel Morgan, MD  omeprazole (PRILOSEC) 40 MG capsule Take 1 capsule (40 mg total) by mouth daily. 01/25/17   Rise MuLeaphart, Kenneth T, PA-C    Family History History reviewed. No pertinent family  history.  Social History Social History  Substance Use Topics  . Smoking status: Current Every Day Smoker  . Smokeless tobacco: Never Used  . Alcohol use No     Allergies   Ibuprofen; Penicillins; Tramadol; and Asa [aspirin]   Review of Systems Review of Systems  Constitutional: Negative for chills, fatigue and fever.  HENT: Negative for congestion and mouth sores.   Eyes: Negative for visual disturbance.  Respiratory: Negative for shortness of breath.   Cardiovascular: Negative for chest pain and palpitations.  Gastrointestinal: Positive for abdominal pain and diarrhea. Negative for blood in stool, nausea and vomiting.  Genitourinary: Negative for difficulty urinating, dysuria, flank pain, frequency, hematuria, pelvic pain, vaginal bleeding and vaginal discharge.  Musculoskeletal: Negative for back pain, gait problem, neck pain and neck stiffness.  Skin: Negative for rash and wound.  Neurological: Negative for dizziness, weakness, light-headedness, numbness and headaches.  Psychiatric/Behavioral: Positive for agitation.     Physical Exam Updated Vital Signs BP 127/66   Pulse 82   Temp 98.5 F (36.9 C) (Oral)   Resp 16   LMP 01/18/2017 (Exact Date)   SpO2 99%   Physical Exam  Constitutional: She is oriented to person, place, and time. She appears well-developed and well-nourished.  Patient is tearful on exam. Appears agitated.   HENT:  Head: Normocephalic and atraumatic.  Mouth/Throat: Oropharynx is clear and moist. No oropharyngeal exudate.  Moist mucous membranes.   Eyes: Pupils are equal, round, and reactive to light. Conjunctivae are normal. Right eye exhibits no discharge. Left eye exhibits no discharge.  Neck: Normal range of motion. Neck supple.  Cardiovascular: Normal rate and regular rhythm.  Exam reveals no friction rub.   No murmur heard. Pulmonary/Chest: Effort normal and breath sounds normal. No respiratory distress. She has no wheezes. She has no  rales.  Abdominal: Soft. Bowel sounds are normal.  Abdomen non-distended. Patient is tender to palpation over the epigastrium. No guarding or rigidity. No rebound tenderness. No CVA tenderness.   Musculoskeletal: Normal range of motion.  Lymphadenopathy:    She has no cervical adenopathy.  Neurological: She is alert and oriented to person, place, and time. Coordination normal.  Skin: Skin is warm and dry. Capillary refill takes less than 2 seconds.  Psychiatric: She has a normal mood and affect.  Nursing note and vitals reviewed.    ED Treatments / Results  Labs (all labs ordered are listed, but only abnormal results are displayed) Labs Reviewed  COMPREHENSIVE METABOLIC PANEL - Abnormal; Notable for the following:       Result Value   Potassium 3.3 (*)    Calcium 8.8 (*)    Total Protein 6.4 (*)    ALT 11 (*)    All other components within normal limits  CBC - Abnormal; Notable for the following:    WBC 13.0 (*)    RBC 3.76 (*)    Hemoglobin 11.3 (*)    HCT 33.8 (*)    Platelets 437 (*)  All other components within normal limits  LIPASE, BLOOD  URINALYSIS, ROUTINE W REFLEX MICROSCOPIC  POC URINE PREG, ED    EKG  EKG Interpretation None       Radiology US Abdomen Complete  Result Date: 01/25/2017 CLINICAL DATA:  Three-day history of upper abdominal/epigastric pain EXAM: ABDOMEN ULTRASOUND COMPLETE COMPARISON:  CT abdomen and pelvis November 22, 2016 FINDINGS: Gallbladder: No gallstones or wall thickening visualized. There is no pericholecystic fluid. No sonographic Murphy sign noted by sonographer. Common bile duct: Diameter: 3 mm. No intrahepatic, common hepatic, or common bile duct dilatation. Liver: No focal lesion identified. Within normal limits in parenchymal echogenicity. Portal vein is patent on color Doppler imaging with normal direction of blood flow towards the liver. IVC: No abnormality visualized. Pancreas: No pancreatic mass or inflammatory focus. Spleen:  Size and appearance within normal limits. Right Kidney: Length: 10.0 cm. Echogenicity within normal limits. No mass or hydronephrosis visualized. Left Kidney: Length: 10.1 cm. Echogenicity within normal limits. No mass or hydronephrosis visualized. Abdominal aorta: No aneurysm visualized. Other findings: No demonstrable ascites. IMPRESSION: Study within normal limits. Electronically Signed   By: Bretta Bang III M.D.   On: 01/25/2017 19:06    Procedures Procedures (including critical care time)  Medications Ordered in ED Medications  famotidine (PEPCID) tablet 20 mg (not administered)  pantoprazole (PROTONIX) EC tablet 20 mg (not administered)  gi cocktail (Maalox,Lidocaine,Donnatal) (30 mLs Oral Given 01/27/17 0001)  dicyclomine (BENTYL) capsule 10 mg (10 mg Oral Given 01/27/17 0000)     Initial Impression / Assessment and Plan / ED Course  I have reviewed the triage vital signs and the nursing notes.  Pertinent labs & imaging results that were available during my care of the patient were reviewed by me and considered in my medical decision making (see chart for details).     Patient presents to the ED with complaint of epigastric abdominal pain. She has been evaluated for this complaint several times in the ED, last seen yesterday for this complaint. She had a normal abdominal ultrasound study yesterday and a normal abdominal CT scan in August. She denies any new or worsening symptoms from previous. She is asking specifically for narcotic pain medication. When told that we will try GI cocktail, bentyl, protonix and pepcid patient throws the ice pack in the air and screams that "it wont help, I've been waiting for 8hrs in the waiting room and you need to do more for me."   Labs reviewed, no signs of infection on UA. Lipase negative, do not suspect pancreatitis. She also has mild anemia (Hgb 11.3), this is baseline for her. Kidney function is normal. Liver enzymes normal. Do not suspect  cholecystitis, hepatitis. Do not suspect obstruction given no history of vomiting, abdomen non-distended.   She has a mild leukocytosis (13.0.) Suspect that this is reactive. She is afebrile, non-toxic appearing. No signs of peritonitis on exam. Do not suspect surgical abdomen.    Discussed this patient with Dr. Bebe Shaggy who also saw the patient and agrees with plan to not give narcotic pain medication given patients symptoms are chronic. Have counseled patient that we do not manage chronic pain in the Emergency Department. Spoke to her about the importance of establishing care at PCP office for ongoing pain management concerns. Have also spoken to her about potential need to see gastroenterology. From previous records she has been given information to follow up with GI, primary care but patient states she has not done this.   Patient  received gi cocktail and bentyl in the ED and walked out prior to reevaluation.   Final Clinical Impressions(s) / ED Diagnoses   Final diagnoses:  Epigastric pain    New Prescriptions Discharge Medication List as of 01/27/2017 12:14 AM       Kellie Shropshire, PA-C 01/27/17 1141    Zadie Rhine, MD 01/28/17 0021

## 2017-01-27 MED ORDER — PANTOPRAZOLE SODIUM 20 MG PO TBEC
20.0000 mg | DELAYED_RELEASE_TABLET | Freq: Once | ORAL | Status: DC
  Filled 2017-01-27: qty 1

## 2017-01-27 MED ORDER — FAMOTIDINE 20 MG PO TABS: 20.0000 mg | ORAL_TABLET | Freq: Once | ORAL | Status: DC

## 2017-01-27 NOTE — ED Notes (Signed)
Pt refused the remainder of her ordered medications. Pt left AMA and signed for same.

## 2017-01-30 ENCOUNTER — Encounter (HOSPITAL_COMMUNITY): Payer: Self-pay | Admitting: Emergency Medicine

## 2017-01-30 ENCOUNTER — Emergency Department (HOSPITAL_COMMUNITY)
Admission: EM | Admit: 2017-01-30 | Discharge: 2017-01-30 | Disposition: A | Discharge status: Home / Self Care | Payer: Medicaid - Out of State | Attending: Emergency Medicine | Admitting: Emergency Medicine

## 2017-01-30 DIAGNOSIS — F17200 Nicotine dependence, unspecified, uncomplicated: Secondary | ICD-10-CM | POA: Diagnosis not present

## 2017-01-30 DIAGNOSIS — R1013 Epigastric pain: Secondary | ICD-10-CM

## 2017-01-30 DIAGNOSIS — Z880 Allergy status to penicillin: Secondary | ICD-10-CM | POA: Insufficient documentation

## 2017-01-30 DIAGNOSIS — E876 Hypokalemia: Secondary | ICD-10-CM | POA: Diagnosis not present

## 2017-01-30 DIAGNOSIS — Z79899 Other long term (current) drug therapy: Secondary | ICD-10-CM | POA: Insufficient documentation

## 2017-01-30 DIAGNOSIS — R112 Nausea with vomiting, unspecified: Secondary | ICD-10-CM

## 2017-01-30 DIAGNOSIS — K047 Periapical abscess without sinus: Secondary | ICD-10-CM | POA: Diagnosis not present

## 2017-01-30 DIAGNOSIS — J45909 Unspecified asthma, uncomplicated: Secondary | ICD-10-CM | POA: Insufficient documentation

## 2017-01-30 LAB — COMPREHENSIVE METABOLIC PANEL
ALK PHOS: 70 U/L (ref 38–126)
ALT: 11 U/L — AB (ref 14–54)
AST: 19 U/L (ref 15–41)
Albumin: 3.4 g/dL — ABNORMAL LOW (ref 3.5–5.0)
Anion gap: 10 (ref 5–15)
BILIRUBIN TOTAL: 0.5 mg/dL (ref 0.3–1.2)
BUN: 7 mg/dL (ref 6–20)
CALCIUM: 8.4 mg/dL — AB (ref 8.9–10.3)
CO2: 25 mmol/L (ref 22–32)
Chloride: 106 mmol/L (ref 101–111)
Creatinine, Ser: 0.57 mg/dL (ref 0.44–1.00)
GFR calc Af Amer: >60 mL/min (ref >60)
Glucose, Bld: 101 mg/dL — ABNORMAL HIGH (ref 65–99)
Potassium: 3.3 mmol/L — ABNORMAL LOW (ref 3.5–5.1)
Sodium: 141 mmol/L (ref 135–145)
TOTAL PROTEIN: 6.6 g/dL (ref 6.5–8.1)

## 2017-01-30 LAB — CBC
HEMATOCRIT: 32.5 % — AB (ref 36.0–46.0)
Hemoglobin: 11.2 g/dL — ABNORMAL LOW (ref 12.0–15.0)
MCH: 31.5 pg (ref 26.0–34.0)
MCHC: 34.5 g/dL (ref 30.0–36.0)
MCV: 91.3 fL (ref 78.0–100.0)
PLATELETS: 467 10*3/uL — AB (ref 150–400)
RBC: 3.56 MIL/uL — ABNORMAL LOW (ref 3.87–5.11)
RDW: 14 % (ref 11.5–15.5)
WBC: 12.7 10*3/uL — AB (ref 4.0–10.5)

## 2017-01-30 LAB — RAPID URINE DRUG SCREEN, HOSP PERFORMED
Amphetamines: NOT DETECTED
Barbiturates: POSITIVE — AB
Benzodiazepines: NOT DETECTED
COCAINE: NOT DETECTED
OPIATES: NOT DETECTED
TETRAHYDROCANNABINOL: NOT DETECTED

## 2017-01-30 LAB — PREGNANCY, URINE: PREG TEST UR: NEGATIVE

## 2017-01-30 LAB — LIPASE, BLOOD: LIPASE: 26 U/L (ref 11–51)

## 2017-01-30 MED ORDER — HYDROCODONE-ACETAMINOPHEN 5-325 MG PO TABS: 1.0000 | ORAL_TABLET | Freq: Four times a day (QID) | ORAL | 0 refills | Status: DC | PRN

## 2017-01-30 MED ORDER — ONDANSETRON 8 MG PO TBDP: 8.0000 mg | ORAL_TABLET | Freq: Three times a day (TID) | ORAL | 0 refills | Status: DC | PRN

## 2017-01-30 MED ORDER — LORAZEPAM 2 MG/ML IJ SOLN
1.0000 mg | Freq: Once | INTRAMUSCULAR | Status: AC
  Administered 2017-01-30: 1 mg via INTRAVENOUS
  Filled 2017-01-30: qty 1

## 2017-01-30 MED ORDER — CLINDAMYCIN HCL 300 MG PO CAPS: 300.0000 mg | ORAL_CAPSULE | Freq: Three times a day (TID) | ORAL | 0 refills | Status: DC

## 2017-01-30 MED ORDER — SODIUM CHLORIDE 0.9 % IV BOLUS (SEPSIS)
1000.0000 mL | Freq: Once | INTRAVENOUS | Status: AC
  Administered 2017-01-30: 1000 mL via INTRAVENOUS

## 2017-01-30 MED ORDER — ONDANSETRON HCL 4 MG/2ML IJ SOLN
4.0000 mg | Freq: Once | INTRAMUSCULAR | Status: AC
  Administered 2017-01-30: 4 mg via INTRAVENOUS
  Filled 2017-01-30: qty 2

## 2017-01-30 MED ORDER — HYDROMORPHONE HCL 1 MG/ML IJ SOLN
0.5000 mg | Freq: Once | INTRAMUSCULAR | Status: AC
  Administered 2017-01-30: 0.5 mg via INTRAVENOUS
  Filled 2017-01-30: qty 1

## 2017-01-30 MED ORDER — FAMOTIDINE IN NACL 20-0.9 MG/50ML-% IV SOLN
20.0000 mg | Freq: Once | INTRAVENOUS | Status: AC
  Administered 2017-01-30: 20 mg via INTRAVENOUS
  Filled 2017-01-30: qty 50

## 2017-01-30 MED ORDER — POTASSIUM CHLORIDE CRYS ER 20 MEQ PO TBCR
20.0000 meq | EXTENDED_RELEASE_TABLET | Freq: Once | ORAL | Status: AC
  Administered 2017-01-30: 20 meq via ORAL
  Filled 2017-01-30: qty 1

## 2017-01-30 MED ORDER — ACETAMINOPHEN 500 MG PO TABS
1000.0000 mg | ORAL_TABLET | Freq: Once | ORAL | Status: AC
  Administered 2017-01-30: 1000 mg via ORAL
  Filled 2017-01-30: qty 2

## 2017-01-30 MED ORDER — PANTOPRAZOLE SODIUM 40 MG PO TBEC: 40.0000 mg | DELAYED_RELEASE_TABLET | Freq: Every day | ORAL | 0 refills | Status: DC

## 2017-01-30 NOTE — Discharge Instructions (Signed)
It was our pleasure to provide your ER care today - we hope that you feel better.  Rest. Drink plenty of fluids.  Take protonix (acid blocker medication). You may also try pepcid and maalox as need for symptom relief.  Take zofran as need for nausea.    You may take hydrocodone as need for pain. No driving for the next 6 hours or when taking hydrocodone. Also, do not take tylenol or acetaminophen containing medication when taking hydrocodone.  For dental issue, also take antibiotic as prescribed. Follow up with dentist in coming week.  From today's labs, your potassium level is mildly low (3.3) - eat plenty of fruits and vegetables, and follow up with primary care doctor.   For primary care, follow up with primary care doctor in the next 1-2 weeks.  For recurrent GI symptoms, follow up with GI doctor in the next few weeks - see referral - call office this week to arrange appointment.   Return to ER if worse, new symptoms, fevers, persistent vomiting, other concern.

## 2017-01-30 NOTE — ED Triage Notes (Signed)
Pt reports she has been having gastritis for the past 3 days accompanied by diarrhea and abd pain. Also reports she has top R dental pain, thinks a  Tooth may have broken off.

## 2017-01-30 NOTE — ED Provider Notes (Signed)
[facility]-EMERGENCY DEPT Provider Note   CSN: [id number] Arrival date & time: 01/30/17  7829     History   Chief Complaint Chief Complaint  Patient presents with  . Emesis  . Dental Pain    HPI Shirley Mckinney is a 36 y.o. female.  Patient c/o epigastric pain and nausea/vomiting in the past couple days. Pain persistent, dull to burning, moderate, non radiating. States hx same on recurrent basis for past 1-2 years. States hx 'gastritis'. Denies prior gi eval or prior endoscopy. Takes pepcid and carafate prn. Denies fever or chills. No chest pain or discomfort. No sob. No hx pancreatitis or gallstones. Is having normal bms. Normal periods, last 2 weeks ago. Emesis is clear, not bloody or bilious. Also c/o right upper dental pain in past few days, moderate. No local dentist.    The history is provided by the patient.  Emesis   Associated symptoms include abdominal pain. Pertinent negatives include no chills, no cough, no fever and no headaches.  Dental Pain      Past Medical History:  Diagnosis Date  . Asthma   . Gastritis     There are no active problems to display for this patient.   Past Surgical History:  Procedure Laterality Date  . APPENDECTOMY      OB History    No data available       Home Medications    Prior to Admission medications   Medication Sig Start Date End Date Taking? Authorizing Provider  albuterol (PROVENTIL HFA;VENTOLIN HFA) 108 (90 Base) MCG/ACT inhaler Inhale 1-2 puffs into the lungs every 6 (six) hours as needed for wheezing or shortness of breath. 11/25/16   Long, Arlyss Repress, MD  albuterol (PROVENTIL) (2.5 MG/3ML) 0.083% nebulizer solution Take 3 mLs (2.5 mg total) by nebulization every 6 (six) hours as needed for wheezing or shortness of breath. 11/25/16   Long, Arlyss Repress, MD  benzonatate (TESSALON) 100 MG capsule Take 1 capsule (100 mg total) by mouth every 8 (eight) hours. 01/20/17   Hedges, Tinnie Gens, PA-C  dicyclomine  (BENTYL) 10 MG capsule Take 1 capsule (10 mg total) by mouth 3 (three) times daily as needed for spasms. 01/25/17   Rise Mu, PA-C  famotidine (PEPCID) 20 MG tablet Take 1 tablet (20 mg total) by mouth 3 (three) times daily. 01/25/17   Rise Mu, PA-C  nitrofurantoin, macrocrystal-monohydrate, (MACROBID) 100 MG capsule Take 1 capsule (100 mg total) by mouth 2 (two) times daily. Patient not taking: Reported on 01/26/2017 12/26/16   Little, Ambrose Finland, MD  omeprazole (PRILOSEC) 40 MG capsule Take 1 capsule (40 mg total) by mouth daily. 01/25/17   Demetrios Loll T, PA-C  ondansetron (ZOFRAN ODT) 4 MG disintegrating tablet Take 1 tablet (4 mg total) by mouth every 8 (eight) hours as needed for nausea or vomiting. 01/25/17   Rise Mu, PA-C  sucralfate (CARAFATE) 1 GM/10ML suspension Take 10 mLs (1 g total) by mouth 4 (four) times daily -  with meals and at bedtime. 01/25/17   Rise Mu, PA-C    Family History History reviewed. No pertinent family history.  Social History Social History  Substance Use Topics  . Smoking status: Current Every Day Smoker  . Smokeless tobacco: Never Used  . Alcohol use No     Allergies   Ibuprofen; Penicillins; Tramadol; and Asa [aspirin]   Review of Systems Review of Systems  Constitutional: Negative for chills and fever.  HENT: Negative for sore  throat.   Eyes: Negative for redness.  Respiratory: Negative for cough and shortness of breath.   Cardiovascular: Negative for chest pain.  Gastrointestinal: Positive for abdominal pain and vomiting.  Endocrine: Negative for polyuria.  Genitourinary: Negative for flank pain, vaginal bleeding and vaginal discharge.  Musculoskeletal: Negative for back pain.  Skin: Negative for rash.  Neurological: Negative for headaches.  Hematological: Does not bruise/bleed easily.  Psychiatric/Behavioral: Negative for confusion.     Physical Exam Updated Vital Signs BP (!)  135/101 (BP Location: Right Arm)   Pulse 80   Temp 98.4 F (36.9 C) (Oral)   Resp (!) 22   LMP 01/18/2017 (Exact Date)   SpO2 99%   Physical Exam  Constitutional: She appears well-developed and well-nourished. No distress.  HENT:  Mouth/Throat: Oropharynx is clear and moist.  Right upper molar w decay, mild gum swelling/tenderness. No trismus. No facial swelling.   Eyes: Conjunctivae are normal. No scleral icterus.  Neck: Neck supple. No tracheal deviation present.  Cardiovascular: Normal rate, regular rhythm, normal heart sounds and intact distal pulses.   Pulmonary/Chest: Effort normal and breath sounds normal. No respiratory distress.  Abdominal: Soft. Normal appearance and bowel sounds are normal. She exhibits no distension and no mass. There is tenderness. There is no rebound and no guarding. No hernia.  Epigastric tenderness  Genitourinary:  Genitourinary Comments: No cva tenderness  Musculoskeletal: She exhibits no edema.  Neurological: She is alert.  Skin: Skin is warm and dry. No rash noted. She is not diaphoretic.  Psychiatric:  Very anxious, tearful.   Nursing note and vitals reviewed.    ED Treatments / Results  Labs (all labs ordered are listed, but only abnormal results are displayed) Results for orders placed or performed during the hospital encounter of 01/30/17  Comprehensive metabolic panel  Result Value Ref Range   Sodium 141 135 - 145 mmol/L   Potassium 3.3 (L) 3.5 - 5.1 mmol/L   Chloride 106 101 - 111 mmol/L   CO2 25 22 - 32 mmol/L   Glucose, Bld 101 (H) 65 - 99 mg/dL   BUN 7 6 - 20 mg/dL   Creatinine, Ser 1.610.57 0.44 - 1.00 mg/dL   Calcium 8.4 (L) 8.9 - 10.3 mg/dL   Total Protein 6.6 6.5 - 8.1 g/dL   Albumin 3.4 (L) 3.5 - 5.0 g/dL   AST 19 15 - 41 U/L   ALT 11 (L) 14 - 54 U/L   Alkaline Phosphatase 70 38 - 126 U/L   Total Bilirubin 0.5 0.3 - 1.2 mg/dL   GFR calc non Af Amer >60 >60 mL/min   GFR calc Af Amer >60 >60 mL/min   Anion gap 10 5 - 15    Lipase, blood  Result Value Ref Range   Lipase 26 11 - 51 U/L  CBC  Result Value Ref Range   WBC 12.7 (H) 4.0 - 10.5 K/uL   RBC 3.56 (L) 3.87 - 5.11 MIL/uL   Hemoglobin 11.2 (L) 12.0 - 15.0 g/dL   HCT 09.632.5 (L) 04.536.0 - 40.946.0 %   MCV 91.3 78.0 - 100.0 fL   MCH 31.5 26.0 - 34.0 pg   MCHC 34.5 30.0 - 36.0 g/dL   RDW 81.114.0 91.411.5 - 78.215.5 %   Platelets 467 (H) 150 - 400 K/uL  Pregnancy, urine  Result Value Ref Range   Preg Test, Ur NEGATIVE NEGATIVE  Rapid urine drug screen (hospital performed)  Result Value Ref Range   Opiates NONE DETECTED NONE DETECTED  Cocaine NONE DETECTED NONE DETECTED   Benzodiazepines NONE DETECTED NONE DETECTED   Amphetamines NONE DETECTED NONE DETECTED   Tetrahydrocannabinol NONE DETECTED NONE DETECTED   Barbiturates POSITIVE (A) NONE DETECTED   Dg Chest 2 View  Result Date: 01/20/2017 CLINICAL DATA:  Cough with chest tightness in shortness-of-breath. EXAM: CHEST  2 VIEW COMPARISON:  11/22/2016 FINDINGS: The heart size and mediastinal contours are within normal limits. Both lungs are clear. The visualized skeletal structures are unremarkable. IMPRESSION: No active cardiopulmonary disease. Electronically Signed   By: Elberta Fortis M.D.   On: 01/20/2017 17:43   US Abdomen Complete  Result Date: 01/25/2017 CLINICAL DATA:  Three-day history of upper abdominal/epigastric pain EXAM: ABDOMEN ULTRASOUND COMPLETE COMPARISON:  CT abdomen and pelvis November 22, 2016 FINDINGS: Gallbladder: No gallstones or wall thickening visualized. There is no pericholecystic fluid. No sonographic Murphy sign noted by sonographer. Common bile duct: Diameter: 3 mm. No intrahepatic, common hepatic, or common bile duct dilatation. Liver: No focal lesion identified. Within normal limits in parenchymal echogenicity. Portal vein is patent on color Doppler imaging with normal direction of blood flow towards the liver. IVC: No abnormality visualized. Pancreas: No pancreatic mass or inflammatory  focus. Spleen: Size and appearance within normal limits. Right Kidney: Length: 10.0 cm. Echogenicity within normal limits. No mass or hydronephrosis visualized. Left Kidney: Length: 10.1 cm. Echogenicity within normal limits. No mass or hydronephrosis visualized. Abdominal aorta: No aneurysm visualized. Other findings: No demonstrable ascites. IMPRESSION: Study within normal limits. Electronically Signed   By: Bretta Bang III M.D.   On: 01/25/2017 19:06    EKG  EKG Interpretation None       Radiology No results found.  Procedures Procedures (including critical care time)  Medications Ordered in ED Medications  sodium chloride 0.9 % bolus 1,000 mL (not administered)  LORazepam (ATIVAN) injection 1 mg (not administered)  famotidine (PEPCID) IVPB 20 mg premix (not administered)  ondansetron (ZOFRAN) injection 4 mg (not administered)  HYDROmorphone (DILAUDID) injection 0.5 mg (not administered)     Initial Impression / Assessment and Plan / ED Course  I have reviewed the triage vital signs and the nursing notes.  Pertinent labs & imaging results that were available during my care of the patient were reviewed by me and considered in my medical decision making (see chart for details).  Iv ns bolus.  zofran iv.   Dilaudid .5 mg iv for pain. Ativan 1 mg iv for anxiety.   Labs.  Reviewed nursing notes and prior charts for additional history.   kcl po.  Po fluds.  Patient indicates new to area, requests referral to pcp, gi, dental - will provide referrals.   rx for home.   Recheck abd  Soft nt. No recurrent emesis.   Final Clinical Impressions(s) / ED Diagnoses   Final diagnoses:  None    New Prescriptions New Prescriptions   No medications on file     Cathren Laine, MD 01/30/17 1051

## 2017-02-16 ENCOUNTER — Emergency Department (HOSPITAL_COMMUNITY): Payer: Medicaid - Out of State

## 2017-02-16 ENCOUNTER — Emergency Department (HOSPITAL_COMMUNITY)
Admission: EM | Admit: 2017-02-16 | Discharge: 2017-02-16 | Disposition: A | Discharge status: Home / Self Care | Payer: Medicaid - Out of State | Attending: Emergency Medicine | Admitting: Emergency Medicine

## 2017-02-16 ENCOUNTER — Encounter (HOSPITAL_COMMUNITY): Payer: Self-pay | Admitting: Emergency Medicine

## 2017-02-16 DIAGNOSIS — F17210 Nicotine dependence, cigarettes, uncomplicated: Secondary | ICD-10-CM | POA: Diagnosis not present

## 2017-02-16 DIAGNOSIS — K047 Periapical abscess without sinus: Secondary | ICD-10-CM | POA: Diagnosis not present

## 2017-02-16 DIAGNOSIS — J181 Lobar pneumonia, unspecified organism: Secondary | ICD-10-CM

## 2017-02-16 DIAGNOSIS — K029 Dental caries, unspecified: Secondary | ICD-10-CM | POA: Diagnosis not present

## 2017-02-16 DIAGNOSIS — J189 Pneumonia, unspecified organism: Secondary | ICD-10-CM | POA: Diagnosis not present

## 2017-02-16 DIAGNOSIS — R51 Headache: Secondary | ICD-10-CM | POA: Diagnosis not present

## 2017-02-16 DIAGNOSIS — J9801 Acute bronchospasm: Secondary | ICD-10-CM

## 2017-02-16 DIAGNOSIS — K0889 Other specified disorders of teeth and supporting structures: Secondary | ICD-10-CM | POA: Diagnosis present

## 2017-02-16 MED ORDER — PREDNISONE 20 MG PO TABS
60.0000 mg | ORAL_TABLET | Freq: Once | ORAL | Status: AC
  Administered 2017-02-16: 60 mg via ORAL
  Filled 2017-02-16: qty 3

## 2017-02-16 MED ORDER — HYDROCODONE-ACETAMINOPHEN 5-325 MG PO TABS
1.0000 | ORAL_TABLET | Freq: Once | ORAL | Status: AC
  Administered 2017-02-16: 1 via ORAL
  Filled 2017-02-16: qty 1

## 2017-02-16 MED ORDER — IPRATROPIUM-ALBUTEROL 0.5-2.5 (3) MG/3ML IN SOLN
3.0000 mL | Freq: Once | RESPIRATORY_TRACT | Status: AC
  Administered 2017-02-16: 3 mL via RESPIRATORY_TRACT
  Filled 2017-02-16: qty 3

## 2017-02-16 MED ORDER — HYDROCOD POLST-CPM POLST ER 10-8 MG/5ML PO SUER
5.0000 mL | Freq: Once | ORAL | Status: AC
  Administered 2017-02-16: 5 mL via ORAL
  Filled 2017-02-16: qty 5

## 2017-02-16 MED ORDER — PREDNISONE 50 MG PO TABS: ORAL_TABLET | ORAL | 0 refills | Status: DC

## 2017-02-16 MED ORDER — HYDROCODONE-ACETAMINOPHEN 5-325 MG PO TABS: 2.0000 | ORAL_TABLET | Freq: Four times a day (QID) | ORAL | 0 refills | Status: DC | PRN

## 2017-02-16 MED ORDER — AZITHROMYCIN 250 MG PO TABS: ORAL_TABLET | ORAL | 0 refills | Status: DC

## 2017-02-16 MED ORDER — BENZONATATE 100 MG PO CAPS: 100.0000 mg | ORAL_CAPSULE | Freq: Three times a day (TID) | ORAL | 0 refills | Status: AC

## 2017-02-16 NOTE — Discharge Instructions (Signed)
Follow up with a dentist as soon as possible. Do not drive while taking the narcotic as it will make you sleepy. [facility] to schedule a follow up appointment in the next couple days for recheck of the pneumonia.  Use the inhaler 2 puffs every 4 hours as needed. Return here for worsening symptoms.

## 2017-02-16 NOTE — ED Notes (Signed)
Bed: WTR8 Expected date:  Expected time:  Means of arrival:  Comments: [place] after triage

## 2017-02-16 NOTE — ED Triage Notes (Signed)
patient c/o right upper posterior tooth pain due to broken tooth over past couple days that radiating to right ear. Patient also has cough with yellow phlegm for coupple days as well.

## 2017-02-16 NOTE — ED Provider Notes (Signed)
[facility]-EMERGENCY DEPT Provider Note   CSN: [id number] Arrival date & time: 02/16/17  1629     History   Chief Complaint Chief Complaint  Patient presents with  . Dental Pain  . Cough    HPI Shirley Mckinney is a 36 y.o. female who presents to the ED with dental pain that started 5 days ago and has gotten worse. The pain is located in the right upper dental area. The pain radiates to the right ear. Patient has been taking tylenol without relief.   Patient also c/o cough and wheezing. Hx of asthma, fever, chills. Cough is productive with yellow sputum.   The history is provided by the patient. No language interpreter was used.  Dental Pain   This is a new problem. The current episode started more than 2 days ago. The problem occurs constantly. The problem has been gradually worsening. The pain is at a severity of 10/10.  Cough  This is a new problem. The current episode started 2 days ago. The cough is productive of sputum. The maximum temperature recorded prior to her arrival was 100 to 100.9 F. Associated symptoms include chills, ear congestion, ear pain, headaches and wheezing. Pertinent negatives include no chest pain and no sore throat. She is a smoker. Her past medical history is significant for asthma.    Past Medical History:  Diagnosis Date  . Asthma   . Gastritis     There are no active problems to display for this patient.   Past Surgical History:  Procedure Laterality Date  . APPENDECTOMY      OB History    No data available       Home Medications    Prior to Admission medications   Medication Sig Start Date End Date Taking? Authorizing Provider  albuterol (PROVENTIL HFA;VENTOLIN HFA) 108 (90 Base) MCG/ACT inhaler Inhale 1-2 puffs into the lungs every 6 (six) hours as needed for wheezing or shortness of breath. 11/25/16   Long, Arlyss RepressJoshua G, MD  albuterol (PROVENTIL) (2.5 MG/3ML) 0.083% nebulizer solution Take 3 mLs (2.5 mg total) by  nebulization every 6 (six) hours as needed for wheezing or shortness of breath. 11/25/16   Long, Arlyss RepressJoshua G, MD  azithromycin (ZITHROMAX Z-PAK) 250 MG tablet Take first 2 tablets PO now and then one tablet daily for infection 02/16/17   Janne NapoleonNeese, Hope M, NP  benzonatate (TESSALON) 100 MG capsule Take 1 capsule (100 mg total) every 8 (eight) hours by mouth. 02/16/17   Janne NapoleonNeese, Hope M, NP  clindamycin (CLEOCIN) 300 MG capsule Take 1 capsule (300 mg total) by mouth 3 (three) times daily. 01/30/17   Cathren LaineSteinl, Kevin, MD  dicyclomine (BENTYL) 10 MG capsule Take 1 capsule (10 mg total) by mouth 3 (three) times daily as needed for spasms. 01/25/17   Rise MuLeaphart, Kenneth T, PA-C  famotidine (PEPCID) 20 MG tablet Take 1 tablet (20 mg total) by mouth 3 (three) times daily. 01/25/17   Rise MuLeaphart, Kenneth T, PA-C  HYDROcodone-acetaminophen (NORCO/VICODIN) 5-325 MG tablet Take 2 tablets every 6 (six) hours as needed by mouth. For pain and cough 02/16/17   Kerrie BuffaloNeese, Hope M, NP  omeprazole (PRILOSEC) 40 MG capsule Take 1 capsule (40 mg total) by mouth daily. 01/25/17   Demetrios LollLeaphart, Kenneth T, PA-C  ondansetron (ZOFRAN ODT) 4 MG disintegrating tablet Take 1 tablet (4 mg total) by mouth every 8 (eight) hours as needed for nausea or vomiting. 01/25/17   Demetrios LollLeaphart, Kenneth T, PA-C  ondansetron (ZOFRAN ODT) 8 MG  disintegrating tablet Take 1 tablet (8 mg total) by mouth every 8 (eight) hours as needed for nausea or vomiting. 01/30/17   Cathren Laine, MD  pantoprazole (PROTONIX) 40 MG tablet Take 1 tablet (40 mg total) by mouth daily. 01/30/17   Cathren Laine, MD  predniSONE (DELTASONE) 50 MG tablet Starting tomorrow 02/17/17 take one tablet PO daily 02/16/17   Janne Napoleon, NP  sucralfate (CARAFATE) 1 GM/10ML suspension Take 10 mLs (1 g total) by mouth 4 (four) times daily -  with meals and at bedtime. 01/25/17   Rise Mu, PA-C    Family History No family history on file.  Social History Social History   Tobacco Use  . Smoking  status: Current Every Day Smoker    Types: Cigarettes  . Smokeless tobacco: Never Used  Substance Use Topics  . Alcohol use: No  . Drug use: No     Allergies   Ibuprofen; Penicillins; Tramadol; and Asa [aspirin]   Review of Systems Review of Systems  Constitutional: Positive for chills and fever.  HENT: Positive for congestion and ear pain. Negative for sore throat.   Eyes: Negative for photophobia, pain, itching and visual disturbance.  Respiratory: Positive for cough and wheezing.   Cardiovascular: Negative for chest pain.  Gastrointestinal: Negative for abdominal pain, diarrhea, nausea and vomiting.  Genitourinary: Negative for dysuria and frequency.  Musculoskeletal: Negative for back pain, neck pain and neck stiffness.  Skin: Negative for rash and wound.  Neurological: Positive for headaches. Negative for syncope.  Psychiatric/Behavioral: Negative for confusion.     Physical Exam Updated Vital Signs BP 115/80 (BP Location: Left Arm)   Pulse 85   Temp 97.8 F (36.6 C) (Oral)   Resp 20   Ht 5\' 2"  (1.575 m)   Wt 83.1 kg (183 lb 4.8 oz)   LMP 01/21/2017 Comment: abnormal cycles  SpO2 98%   BMI 33.53 kg/m   Physical Exam  Constitutional: She appears well-developed and well-nourished. No distress.  HENT:  Head: Normocephalic and atraumatic.  Nose: Rhinorrhea present.  Mouth/Throat: Uvula is midline and mucous membranes are normal. Dental caries present. Posterior oropharyngeal erythema present. No posterior oropharyngeal edema.  Right upper second molar with decay to the gumline. Swelling surrounding the tooth and erythema of the gum.   Eyes: EOM are normal.  Neck: Neck supple.  Cardiovascular: Regular rhythm. Tachycardia present.  Pulmonary/Chest: No respiratory distress. She has decreased breath sounds. She has wheezes in the right upper field and the left upper field. She has rales in the right lower field.  Abdominal: Soft. There is no tenderness.    Musculoskeletal: Normal range of motion.  Neurological: She is alert.  Skin: Skin is warm and dry.  Psychiatric: She has a normal mood and affect. Her behavior is normal.  Nursing note and vitals reviewed.    ED Treatments / Results  Labs (all labs ordered are listed, but only abnormal results are displayed) Labs Reviewed - No data to display  EKG Radiology Dg Chest 2 View  Result Date: 02/16/2017 CLINICAL DATA:  Shortness of breath, cough and fever for 3 days. EXAM: CHEST  2 VIEW COMPARISON:  01/20/2017 FINDINGS: New right lower lung airspace disease is compatible with pneumonia. The cardiomediastinal silhouette is unremarkable. The left lung is clear. No pleural effusion or pneumothorax. IMPRESSION: Right lower lung pneumonia. Electronically Signed   By: Harmon Pier M.D.   On: 02/16/2017 19:41    Procedures: patient re examined after neb treatment and wheezing  improved but rales RLL heard.  Procedures (including critical care time)  Medications Ordered in ED Medications  ipratropium-albuterol (DUONEB) 0.5-2.5 (3) MG/3ML nebulizer solution 3 mL (3 mLs Nebulization Given 02/16/17 1825)  predniSONE (DELTASONE) tablet 60 mg (60 mg Oral Given 02/16/17 1823)  HYDROcodone-acetaminophen (NORCO/VICODIN) 5-325 MG per tablet 1 tablet (1 tablet Oral Given 02/16/17 1823)  chlorpheniramine-HYDROcodone (TUSSIONEX) 10-8 MG/5ML suspension 5 mL (5 mLs Oral Given 02/16/17 1920)     Initial Impression / Assessment and Plan / ED Course  I have reviewed the triage vital signs and the nursing notes. 36 y.o. female with cough and wheezing, fever and chills responded to neb treatment. Stable for d/c without respiratory distress and O2 SAT 98% on R/A. X-ray shows RLL pneumonia. Will treat with antibiotics and cough medication. Patient to f/u with PCP.   Patient with toothache.  No gross abscess.  Exam unconcerning for Ludwig's angina or spread of infection.  Will treat with antibiotics and pain medicine.   Urged patient to follow-up with dentist.   Final Clinical Impressions(s) / ED Diagnoses   Final diagnoses:  Dental infection  Pneumonia of right lower lobe due to infectious organism [facility](HCC)    ED Discharge Orders        Ordered    azithromycin (ZITHROMAX Z-PAK) 250 MG tablet     02/16/17 2115    benzonatate (TESSALON) 100 MG capsule  Every 8 hours     02/16/17 2115    predniSONE (DELTASONE) 50 MG tablet     02/16/17 2115    HYDROcodone-acetaminophen (NORCO/VICODIN) 5-325 MG tablet  Every 6 hours PRN     02/16/17 2115       Janne Napoleoneese, Hope M, NP 02/16/17 2127    Maia PlanLong, Joshua G, MD 02/17/17 1419

## 2017-02-18 ENCOUNTER — Emergency Department (HOSPITAL_COMMUNITY)
Admission: EM | Admit: 2017-02-18 | Discharge: 2017-02-18 | Disposition: A | Discharge status: Home / Self Care | Payer: Medicaid Other | Attending: Emergency Medicine | Admitting: Emergency Medicine

## 2017-02-18 ENCOUNTER — Encounter (HOSPITAL_COMMUNITY): Payer: Self-pay | Admitting: Emergency Medicine

## 2017-02-18 ENCOUNTER — Other Ambulatory Visit: Payer: Self-pay

## 2017-02-18 DIAGNOSIS — J181 Lobar pneumonia, unspecified organism: Secondary | ICD-10-CM | POA: Insufficient documentation

## 2017-02-18 DIAGNOSIS — Z79899 Other long term (current) drug therapy: Secondary | ICD-10-CM | POA: Insufficient documentation

## 2017-02-18 DIAGNOSIS — R1013 Epigastric pain: Secondary | ICD-10-CM | POA: Diagnosis present

## 2017-02-18 DIAGNOSIS — K2900 Acute gastritis without bleeding: Secondary | ICD-10-CM | POA: Diagnosis not present

## 2017-02-18 DIAGNOSIS — D649 Anemia, unspecified: Secondary | ICD-10-CM | POA: Diagnosis not present

## 2017-02-18 DIAGNOSIS — F17210 Nicotine dependence, cigarettes, uncomplicated: Secondary | ICD-10-CM | POA: Diagnosis not present

## 2017-02-18 DIAGNOSIS — J189 Pneumonia, unspecified organism: Secondary | ICD-10-CM

## 2017-02-18 DIAGNOSIS — R110 Nausea: Secondary | ICD-10-CM | POA: Diagnosis not present

## 2017-02-18 DIAGNOSIS — R197 Diarrhea, unspecified: Secondary | ICD-10-CM | POA: Insufficient documentation

## 2017-02-18 LAB — I-STAT BETA HCG BLOOD, ED (MC, WL, AP ONLY): I-stat hCG, quantitative: <5 m[IU]/mL (ref <5)

## 2017-02-18 LAB — COMPREHENSIVE METABOLIC PANEL
ALT: 11 U/L — ABNORMAL LOW (ref 14–54)
ANION GAP: 9 (ref 5–15)
AST: 18 U/L (ref 15–41)
Albumin: 3.7 g/dL (ref 3.5–5.0)
Alkaline Phosphatase: 63 U/L (ref 38–126)
BUN: 9 mg/dL (ref 6–20)
CALCIUM: 8.9 mg/dL (ref 8.9–10.3)
CHLORIDE: 106 mmol/L (ref 101–111)
CO2: 23 mmol/L (ref 22–32)
Creatinine, Ser: 0.59 mg/dL (ref 0.44–1.00)
GFR calc non Af Amer: >60 mL/min (ref >60)
Glucose, Bld: 105 mg/dL — ABNORMAL HIGH (ref 65–99)
Potassium: 3.9 mmol/L (ref 3.5–5.1)
SODIUM: 138 mmol/L (ref 135–145)
Total Bilirubin: 0.3 mg/dL (ref 0.3–1.2)
Total Protein: 7.3 g/dL (ref 6.5–8.1)

## 2017-02-18 LAB — URINALYSIS, ROUTINE W REFLEX MICROSCOPIC
Bilirubin Urine: NEGATIVE
GLUCOSE, UA: NEGATIVE mg/dL
HGB URINE DIPSTICK: NEGATIVE
Ketones, ur: NEGATIVE mg/dL
Leukocytes, UA: NEGATIVE
Nitrite: NEGATIVE
Protein, ur: NEGATIVE mg/dL
SPECIFIC GRAVITY, URINE: 1.016 (ref 1.005–1.030)
pH: 7 (ref 5.0–8.0)

## 2017-02-18 LAB — DIFFERENTIAL
BASOS ABS: 0 10*3/uL (ref 0.0–0.1)
Basophils Relative: 0 %
Eosinophils Absolute: 0.1 10*3/uL (ref 0.0–0.7)
Eosinophils Relative: 0 %
LYMPHS ABS: 4.2 10*3/uL — AB (ref 0.7–4.0)
LYMPHS PCT: 28 %
MONOS PCT: 4 %
Monocytes Absolute: 0.6 10*3/uL (ref 0.1–1.0)
NEUTROS ABS: 10.1 10*3/uL — AB (ref 1.7–7.7)
Neutrophils Relative %: 68 %

## 2017-02-18 LAB — CBC
HCT: 31.9 % — ABNORMAL LOW (ref 36.0–46.0)
HEMOGLOBIN: 10.9 g/dL — AB (ref 12.0–15.0)
MCH: 31 pg (ref 26.0–34.0)
MCHC: 34.2 g/dL (ref 30.0–36.0)
MCV: 90.6 fL (ref 78.0–100.0)
Platelets: 428 10*3/uL — ABNORMAL HIGH (ref 150–400)
RBC: 3.52 MIL/uL — AB (ref 3.87–5.11)
RDW: 13.9 % (ref 11.5–15.5)
WBC: 15.7 10*3/uL — ABNORMAL HIGH (ref 4.0–10.5)

## 2017-02-18 LAB — LIPASE, BLOOD: LIPASE: 18 U/L (ref 11–51)

## 2017-02-18 MED ORDER — CEFUROXIME AXETIL 500 MG PO TABS: 500.0000 mg | ORAL_TABLET | Freq: Two times a day (BID) | ORAL | 0 refills | Status: DC

## 2017-02-18 MED ORDER — GI COCKTAIL ~~LOC~~
30.0000 mL | Freq: Once | ORAL | Status: AC
  Administered 2017-02-18: 30 mL via ORAL
  Filled 2017-02-18: qty 30

## 2017-02-18 MED ORDER — PANTOPRAZOLE SODIUM 40 MG PO TBEC
40.0000 mg | DELAYED_RELEASE_TABLET | Freq: Once | ORAL | Status: AC
  Administered 2017-02-18: 40 mg via ORAL
  Filled 2017-02-18: qty 1

## 2017-02-18 MED ORDER — MORPHINE SULFATE (PF) 4 MG/ML IV SOLN
4.0000 mg | Freq: Once | INTRAVENOUS | Status: AC
  Administered 2017-02-18: 4 mg via INTRAVENOUS
  Filled 2017-02-18: qty 1

## 2017-02-18 MED ORDER — ONDANSETRON HCL 4 MG/2ML IJ SOLN
4.0000 mg | Freq: Once | INTRAMUSCULAR | Status: AC
  Administered 2017-02-18: 4 mg via INTRAVENOUS
  Filled 2017-02-18: qty 2

## 2017-02-18 MED ORDER — PANTOPRAZOLE SODIUM 40 MG PO TBEC: 40.0000 mg | DELAYED_RELEASE_TABLET | Freq: Every day | ORAL | 0 refills | Status: AC

## 2017-02-18 MED ORDER — SUCRALFATE 1 G PO TABS
1.0000 g | ORAL_TABLET | Freq: Once | ORAL | Status: AC
  Administered 2017-02-18: 1 g via ORAL
  Filled 2017-02-18: qty 1

## 2017-02-18 MED ORDER — SUCRALFATE 1 G PO TABS: 1.0000 g | ORAL_TABLET | Freq: Three times a day (TID) | ORAL | 0 refills | Status: DC

## 2017-02-18 NOTE — ED Provider Notes (Signed)
[facility]-EMERGENCY DEPT Provider Note   CSN: [id number] Arrival date & time: 02/18/17  0009     History   Chief Complaint Chief Complaint  Patient presents with  . Abdominal Pain    HPI Shirley Mckinney is a 36 y.o. female.  The history is provided by the patient.  Abdominal Pain    She has a history of gastritis and states that it is flaring up.  She started having epigastric pain this morning.  It got worse after she took her first dose of azithromycin.  Azithromycin had been prescribed yesterday for pneumonia.  Pain does not radiate into her back.  There is associated nausea but no vomiting.  She has had diarrhea today.  First episode of diarrhea occurred after taking the azithromycin.  She has had 6 loose bowel movements a day, but no longer has a sense of rectal urgency.  She denies fever, chills, sweats.  She does rate her pain a 10/10.  She took a dose of Pepcid with no relief.  Past Medical History:  Diagnosis Date  . Asthma   . Gastritis     There are no active problems to display for this patient.   Past Surgical History:  Procedure Laterality Date  . APPENDECTOMY      OB History    No data available       Home Medications    Prior to Admission medications   Medication Sig Start Date End Date Taking? Authorizing Provider  albuterol (PROVENTIL HFA;VENTOLIN HFA) 108 (90 Base) MCG/ACT inhaler Inhale 1-2 puffs into the lungs every 6 (six) hours as needed for wheezing or shortness of breath. 11/25/16  Yes Long, Arlyss Repress, MD  albuterol (PROVENTIL) (2.5 MG/3ML) 0.083% nebulizer solution Take 3 mLs (2.5 mg total) by nebulization every 6 (six) hours as needed for wheezing or shortness of breath. 11/25/16  Yes Long, Arlyss Repress, MD  azithromycin (ZITHROMAX Z-PAK) 250 MG tablet Take first 2 tablets PO now and then one tablet daily for infection 02/16/17  Yes Neese, [place], NP  benzonatate (TESSALON) 100 MG capsule Take 1 capsule (100 mg total) every 8  (eight) hours by mouth. 02/16/17  Yes Neese, Hope M, NP  famotidine (PEPCID) 20 MG tablet Take 1 tablet (20 mg total) by mouth 3 (three) times daily. 01/25/17  Yes Rise Mu, PA-C  HYDROcodone-acetaminophen (NORCO/VICODIN) 5-325 MG tablet Take 2 tablets every 6 (six) hours as needed by mouth. For pain and cough 02/16/17  Yes Neese, Dorothy, NP  omeprazole (PRILOSEC) 40 MG capsule Take 1 capsule (40 mg total) by mouth daily. 01/25/17  Yes Leaphart, Lynann Beaver, PA-C  pantoprazole (PROTONIX) 40 MG tablet Take 1 tablet (40 mg total) by mouth daily. 01/30/17  Yes Cathren Laine, MD  predniSONE (DELTASONE) 50 MG tablet Starting tomorrow 02/17/17 take one tablet PO daily 02/16/17  Yes Janne Napoleon, NP    Family History History reviewed. No pertinent family history.  Social History Social History   Tobacco Use  . Smoking status: Current Every Day Smoker    Types: Cigarettes  . Smokeless tobacco: Never Used  Substance Use Topics  . Alcohol use: No  . Drug use: No     Allergies   Ibuprofen; Penicillins; Tramadol; and Asa [aspirin]   Review of Systems Review of Systems  Gastrointestinal: Positive for abdominal pain.  All other systems reviewed and are negative.    Physical Exam Updated Vital Signs BP 113/87 (BP Location: Right Arm)  Pulse 79   Temp 98.1 F (36.7 C) (Oral)   Resp 20   Ht 5\' 2"  (1.575 m)   Wt 83 kg (183 lb)   LMP 01/24/2017 Comment: abnormal cycles  SpO2 100%   BMI 33.47 kg/m   Physical Exam  Nursing note and vitals reviewed.  36 year old female, resting comfortably and in no acute distress. Vital signs are normal. Oxygen saturation is 100%, which is normal. Head is normocephalic and atraumatic. PERRLA, EOMI. Oropharynx is clear. Neck is nontender and supple without adenopathy or JVD. Back is nontender and there is no CVA tenderness. Lungs are clear without rales, wheezes, or rhonchi. Chest is nontender. Heart has regular rate and rhythm without  murmur. Abdomen is soft, flat, with mild epigastric tenderness.  There is no rebound or guarding.  There are no masses or hepatosplenomegaly and peristalsis is hypoactive. Extremities have no cyanosis or edema, full range of motion is present. Skin is warm and dry without rash. Neurologic: Mental status is normal, cranial nerves are intact, there are no motor or sensory deficits.  ED Treatments / Results  Labs (all labs ordered are listed, but only abnormal results are displayed) Labs Reviewed  COMPREHENSIVE METABOLIC PANEL - Abnormal; Notable for the following components:      Result Value   Glucose, Bld 105 (*)    ALT 11 (*)    All other components within normal limits  CBC - Abnormal; Notable for the following components:   WBC 15.7 (*)    RBC 3.52 (*)    Hemoglobin 10.9 (*)    HCT 31.9 (*)    Platelets 428 (*)    All other components within normal limits  URINALYSIS, ROUTINE W REFLEX MICROSCOPIC - Abnormal; Notable for the following components:   Color, Urine STRAW (*)    All other components within normal limits  DIFFERENTIAL - Abnormal; Notable for the following components:   Neutro Abs 10.1 (*)    Lymphs Abs 4.2 (*)    All other components within normal limits  LIPASE, BLOOD  I-STAT BETA HCG BLOOD, ED ([facility], [facility], [facility] ONLY)    Radiology Dg Chest 2 View  Result Date: 02/16/2017 CLINICAL DATA:  Shortness of breath, cough and fever for 3 days. EXAM: CHEST  2 VIEW COMPARISON:  01/20/2017 FINDINGS: New right lower lung airspace disease is compatible with pneumonia. The cardiomediastinal silhouette is unremarkable. The left lung is clear. No pleural effusion or pneumothorax. IMPRESSION: Right lower lung pneumonia. Electronically Signed   By: Harmon PierJeffrey  Hu M.D.   On: 02/16/2017 19:41    Procedures Procedures (including critical care time)  Medications Ordered in ED Medications  morphine 4 MG/ML injection 4 mg (not administered)  sucralfate (CARAFATE) tablet 1 g (not  administered)  gi cocktail (Maalox,Lidocaine,Donnatal) (30 mLs Oral Given 02/18/17 0156)  pantoprazole (PROTONIX) EC tablet 40 mg (40 mg Oral Given 02/18/17 0156)  morphine 4 MG/ML injection 4 mg (4 mg Intravenous Given 02/18/17 0256)  ondansetron (ZOFRAN) injection 4 mg (4 mg Intravenous Given 02/18/17 0256)  morphine 4 MG/ML injection 4 mg (4 mg Intravenous Given 02/18/17 0510)     Initial Impression / Assessment and Plan / ED Course  I have reviewed the triage vital signs and the nursing notes.  Pertinent labs results that were available during my care of the patient were reviewed by me and considered in my medical decision making (see chart for details).  Abdominal pain and nausea consistent with gastritis.  I suspect that symptoms  were made worse by azithromycin.  Old records are reviewed confirming ED visit yesterday with diagnosis of community-acquired pneumonia, prescription for azithromycin.  She is given a GI cocktail.  Screening labs are obtained.  Anticipate the need to change her antibiotic to one that is less irritating to the stomach.  There is no relief with a GI cocktail.  She was given morphine and, eventually did get relief.  Laboratory workup was unremarkable.  Mild anemia is present which is unchanged from baseline.  I reviewed the x-rays, and the diagnosis of pneumonia is tenuous, at best.  However, I will switch the patient from azithromycin to cefuroxime.  She is discharged also with prescriptions for sucralfate and pantoprazole.  Follow-up with PCP in 1 week.  Return precautions discussed.  Final Clinical Impressions(s) / ED Diagnoses   Final diagnoses:  Other acute gastritis without hemorrhage  Normochromic normocytic anemia  Community acquired pneumonia of right lower lobe of lung [facility](HCC)    ED Discharge Orders        Ordered    sucralfate (CARAFATE) 1 g tablet  3 times daily with meals & bedtime     02/18/17 0719    cefUROXime (CEFTIN) 500 MG tablet  2 times daily  with meals     02/18/17 0719    pantoprazole (PROTONIX) 40 MG tablet  Daily     02/18/17 0719       Dione BoozeGlick, Wilhelmena Zea, MD 02/18/17 0725

## 2017-02-18 NOTE — ED Triage Notes (Signed)
Patient complaining of upper abdominal pain and diarrhea. Patient states she has a history of gastritis. Patient states she took all her medication. Pain started this morning 02/17/2017.

## 2017-02-18 NOTE — ED Notes (Signed)
Unable to collect labs patient wants her IV started

## 2017-02-18 NOTE — Discharge Instructions (Signed)
Stop taking azithromycin (Z-Pak) - it is irritating your stomach. Take the pantoprazole (Protonix) and sucralfate (Carafate) as prescribed. Return if symptoms are getting worse.

## 2017-03-11 ENCOUNTER — Encounter (HOSPITAL_COMMUNITY): Payer: Self-pay | Admitting: Emergency Medicine

## 2017-03-11 ENCOUNTER — Emergency Department (HOSPITAL_COMMUNITY)
Admission: EM | Admit: 2017-03-11 | Discharge: 2017-03-11 | Disposition: A | Discharge status: Home / Self Care | Payer: Medicaid Other | Attending: Emergency Medicine | Admitting: Emergency Medicine

## 2017-03-11 ENCOUNTER — Emergency Department (HOSPITAL_COMMUNITY): Payer: Medicaid Other

## 2017-03-11 ENCOUNTER — Other Ambulatory Visit: Payer: Self-pay

## 2017-03-11 DIAGNOSIS — Y999 Unspecified external cause status: Secondary | ICD-10-CM | POA: Diagnosis not present

## 2017-03-11 DIAGNOSIS — J45909 Unspecified asthma, uncomplicated: Secondary | ICD-10-CM | POA: Diagnosis not present

## 2017-03-11 DIAGNOSIS — Y939 Activity, unspecified: Secondary | ICD-10-CM | POA: Insufficient documentation

## 2017-03-11 DIAGNOSIS — Z79899 Other long term (current) drug therapy: Secondary | ICD-10-CM | POA: Insufficient documentation

## 2017-03-11 DIAGNOSIS — F17210 Nicotine dependence, cigarettes, uncomplicated: Secondary | ICD-10-CM | POA: Diagnosis not present

## 2017-03-11 DIAGNOSIS — S0990XA Unspecified injury of head, initial encounter: Secondary | ICD-10-CM | POA: Diagnosis not present

## 2017-03-11 DIAGNOSIS — Y929 Unspecified place or not applicable: Secondary | ICD-10-CM | POA: Insufficient documentation

## 2017-03-11 MED ORDER — SULFAMETHOXAZOLE-TRIMETHOPRIM 800-160 MG PO TABS: 1.0000 | ORAL_TABLET | Freq: Two times a day (BID) | ORAL | 0 refills | Status: AC

## 2017-03-11 MED ORDER — HYDROCODONE-ACETAMINOPHEN 5-325 MG PO TABS: 1.0000 | ORAL_TABLET | Freq: Four times a day (QID) | ORAL | 0 refills | Status: AC | PRN

## 2017-03-11 NOTE — Discharge Instructions (Signed)
Follow-up with Dr. Pollyann Kennedyosen or 1 of his partners next week for recheck
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
## 2017-03-11 NOTE — ED Notes (Signed)
Bed: [facility] Expected date:  Expected time:  Means of arrival:  Comments: 
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
## 2017-03-11 NOTE — ED Notes (Signed)
Ice pack applied to right face.

## 2017-03-11 NOTE — ED Notes (Signed)
Zackowski MD verbalizes will see pt in triage.

## 2017-03-13 ENCOUNTER — Encounter (HOSPITAL_COMMUNITY): Payer: Self-pay | Admitting: *Deleted

## 2017-03-13 DIAGNOSIS — R600 Localized edema: Secondary | ICD-10-CM | POA: Insufficient documentation

## 2017-03-13 DIAGNOSIS — Z5321 Procedure and treatment not carried out due to patient leaving prior to being seen by health care provider: Secondary | ICD-10-CM | POA: Diagnosis not present
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
## 2017-03-14 ENCOUNTER — Emergency Department (HOSPITAL_COMMUNITY)
Admission: EM | Admit: 2017-03-14 | Discharge: 2017-03-14 | Disposition: A | Discharge status: Home / Self Care | Payer: Medicaid Other | Attending: Emergency Medicine | Admitting: Emergency Medicine

## 2017-03-14 NOTE — ED Notes (Addendum)
Pt walked by [facility] desk and laid stickers down as she walked out the door

## 2017-03-18 ENCOUNTER — Encounter (HOSPITAL_COMMUNITY): Payer: Self-pay

## 2017-03-18 ENCOUNTER — Other Ambulatory Visit: Payer: Self-pay

## 2017-03-18 ENCOUNTER — Emergency Department (HOSPITAL_COMMUNITY)
Admission: EM | Admit: 2017-03-18 | Discharge: 2017-03-18 | Disposition: A | Discharge status: Home / Self Care | Payer: Medicaid Other | Attending: Emergency Medicine | Admitting: Emergency Medicine

## 2017-03-18 DIAGNOSIS — F17210 Nicotine dependence, cigarettes, uncomplicated: Secondary | ICD-10-CM | POA: Insufficient documentation

## 2017-03-18 DIAGNOSIS — Z79899 Other long term (current) drug therapy: Secondary | ICD-10-CM | POA: Diagnosis not present

## 2017-03-18 DIAGNOSIS — S0591XA Unspecified injury of right eye and orbit, initial encounter: Secondary | ICD-10-CM | POA: Diagnosis present

## 2017-03-18 DIAGNOSIS — J45909 Unspecified asthma, uncomplicated: Secondary | ICD-10-CM | POA: Insufficient documentation

## 2017-03-18 DIAGNOSIS — S02401D Maxillary fracture, unspecified, subsequent encounter for fracture with routine healing: Secondary | ICD-10-CM

## 2017-03-18 DIAGNOSIS — Y929 Unspecified place or not applicable: Secondary | ICD-10-CM | POA: Insufficient documentation

## 2017-03-18 DIAGNOSIS — Y939 Activity, unspecified: Secondary | ICD-10-CM | POA: Insufficient documentation

## 2017-03-18 DIAGNOSIS — S02401A Maxillary fracture, unspecified, initial encounter for closed fracture: Secondary | ICD-10-CM | POA: Insufficient documentation

## 2017-03-18 DIAGNOSIS — Y999 Unspecified external cause status: Secondary | ICD-10-CM | POA: Insufficient documentation

## 2017-03-18 MED ORDER — FLUORESCEIN SODIUM 1 MG OP STRP
1.0000 | ORAL_STRIP | Freq: Once | OPHTHALMIC | Status: AC
  Administered 2017-03-18: 1 via OPHTHALMIC
  Filled 2017-03-18: qty 1

## 2017-03-18 MED ORDER — HYDROCODONE-ACETAMINOPHEN 5-325 MG PO TABS
1.0000 | ORAL_TABLET | Freq: Once | ORAL | Status: AC
  Administered 2017-03-18: 1 via ORAL
  Filled 2017-03-18: qty 1

## 2017-03-18 MED ORDER — TETRACAINE HCL 0.5 % OP SOLN
2.0000 [drp] | Freq: Once | OPHTHALMIC | Status: AC
  Administered 2017-03-18: 2 [drp] via OPHTHALMIC
  Filled 2017-03-18: qty 4
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
## 2017-03-18 NOTE — ED Notes (Signed)
Patient one touch see provider for assessment.

## 2017-03-31 ENCOUNTER — Other Ambulatory Visit: Payer: Self-pay

## 2017-03-31 ENCOUNTER — Emergency Department (HOSPITAL_COMMUNITY): Payer: Medicaid Other

## 2017-03-31 ENCOUNTER — Encounter (HOSPITAL_COMMUNITY): Payer: Self-pay

## 2017-03-31 DIAGNOSIS — R0789 Other chest pain: Secondary | ICD-10-CM | POA: Insufficient documentation

## 2017-03-31 DIAGNOSIS — J4541 Moderate persistent asthma with (acute) exacerbation: Secondary | ICD-10-CM | POA: Diagnosis not present

## 2017-03-31 DIAGNOSIS — J181 Lobar pneumonia, unspecified organism: Secondary | ICD-10-CM | POA: Diagnosis not present

## 2017-03-31 DIAGNOSIS — R062 Wheezing: Secondary | ICD-10-CM | POA: Insufficient documentation

## 2017-03-31 DIAGNOSIS — F17210 Nicotine dependence, cigarettes, uncomplicated: Secondary | ICD-10-CM | POA: Diagnosis not present

## 2017-03-31 DIAGNOSIS — R05 Cough: Secondary | ICD-10-CM | POA: Diagnosis present

## 2017-03-31 DIAGNOSIS — Z79899 Other long term (current) drug therapy: Secondary | ICD-10-CM | POA: Insufficient documentation

## 2017-03-31 LAB — POC URINE PREG, ED: PREG TEST UR: NEGATIVE

## 2017-03-31 MED ORDER — ALBUTEROL SULFATE (2.5 MG/3ML) 0.083% IN NEBU
5.0000 mg | INHALATION_SOLUTION | Freq: Once | RESPIRATORY_TRACT | Status: AC
  Administered 2017-03-31: 5 mg via RESPIRATORY_TRACT
  Filled 2017-03-31: qty 6

## 2017-03-31 NOTE — ED Triage Notes (Signed)
Pt endorses asthma exacerbation with productive cough of clear fluid. Inspiratory and expiratory wheezing present. Able to speak in complete sentences. Has taken 3 previous nebs today with minimal relief.

## 2017-04-01 ENCOUNTER — Emergency Department (HOSPITAL_COMMUNITY)
Admission: EM | Admit: 2017-04-01 | Discharge: 2017-04-01 | Discharge status: Against Medical Advice | Payer: Medicaid Other | Attending: Emergency Medicine | Admitting: Emergency Medicine

## 2017-04-01 ENCOUNTER — Other Ambulatory Visit: Payer: Self-pay

## 2017-04-01 DIAGNOSIS — J189 Pneumonia, unspecified organism: Secondary | ICD-10-CM

## 2017-04-01 DIAGNOSIS — J181 Lobar pneumonia, unspecified organism: Secondary | ICD-10-CM

## 2017-04-01 DIAGNOSIS — J4541 Moderate persistent asthma with (acute) exacerbation: Secondary | ICD-10-CM

## 2017-04-01 LAB — CBC WITH DIFFERENTIAL/PLATELET
BASOS PCT: 0 %
Basophils Absolute: 0 10*3/uL (ref 0.0–0.1)
EOS PCT: 3 %
Eosinophils Absolute: 0.4 10*3/uL (ref 0.0–0.7)
HEMATOCRIT: 32.8 % — AB (ref 36.0–46.0)
Hemoglobin: 11.1 g/dL — ABNORMAL LOW (ref 12.0–15.0)
Lymphocytes Relative: 32 %
Lymphs Abs: 4.4 10*3/uL — ABNORMAL HIGH (ref 0.7–4.0)
MCH: 31.1 pg (ref 26.0–34.0)
MCHC: 33.8 g/dL (ref 30.0–36.0)
MCV: 91.9 fL (ref 78.0–100.0)
MONO ABS: 0.5 10*3/uL (ref 0.1–1.0)
Monocytes Relative: 4 %
NEUTROS ABS: 8.6 10*3/uL — AB (ref 1.7–7.7)
Neutrophils Relative %: 61 %
PLATELETS: 400 10*3/uL (ref 150–400)
RBC: 3.57 MIL/uL — ABNORMAL LOW (ref 3.87–5.11)
RDW: 13.7 % (ref 11.5–15.5)
WBC: 13.9 10*3/uL — ABNORMAL HIGH (ref 4.0–10.5)

## 2017-04-01 LAB — BASIC METABOLIC PANEL
ANION GAP: 9 (ref 5–15)
BUN: 6 mg/dL (ref 6–20)
CALCIUM: 8.4 mg/dL — AB (ref 8.9–10.3)
CO2: 20 mmol/L — ABNORMAL LOW (ref 22–32)
Chloride: 106 mmol/L (ref 101–111)
Creatinine, Ser: 0.69 mg/dL (ref 0.44–1.00)
Glucose, Bld: 92 mg/dL (ref 65–99)
Potassium: 3.6 mmol/L (ref 3.5–5.1)
SODIUM: 135 mmol/L (ref 135–145)

## 2017-04-01 MED ORDER — IPRATROPIUM-ALBUTEROL 0.5-2.5 (3) MG/3ML IN SOLN
3.0000 mL | Freq: Once | RESPIRATORY_TRACT | Status: AC
  Administered 2017-04-01: 3 mL via RESPIRATORY_TRACT
  Filled 2017-04-01: qty 3

## 2017-04-01 MED ORDER — CEFUROXIME AXETIL 500 MG PO TABS: 500.0000 mg | ORAL_TABLET | Freq: Two times a day (BID) | ORAL | 0 refills | Status: AC

## 2017-04-01 MED ORDER — ALBUTEROL SULFATE (2.5 MG/3ML) 0.083% IN NEBU: 2.5000 mg | INHALATION_SOLUTION | Freq: Four times a day (QID) | RESPIRATORY_TRACT | 0 refills | Status: AC | PRN

## 2017-04-01 MED ORDER — ACETAMINOPHEN 325 MG PO TABS
650.0000 mg | ORAL_TABLET | Freq: Once | ORAL | Status: AC
  Administered 2017-04-01: 650 mg via ORAL
  Filled 2017-04-01: qty 2

## 2017-04-01 MED ORDER — LEVOFLOXACIN IN D5W 500 MG/100ML IV SOLN
500.0000 mg | Freq: Once | INTRAVENOUS | Status: AC
  Administered 2017-04-01: 500 mg via INTRAVENOUS
  Filled 2017-04-01: qty 100

## 2017-04-01 MED ORDER — ALBUTEROL SULFATE HFA 108 (90 BASE) MCG/ACT IN AERS: 1.0000 | INHALATION_SPRAY | RESPIRATORY_TRACT | 0 refills | Status: AC | PRN

## 2017-04-01 MED ORDER — PREDNISONE 50 MG PO TABS: ORAL_TABLET | ORAL | 0 refills | Status: AC

## 2017-04-01 MED ORDER — METHYLPREDNISOLONE SODIUM SUCC 125 MG IJ SOLR
125.0000 mg | Freq: Once | INTRAMUSCULAR | Status: AC
  Administered 2017-04-01: 125 mg via INTRAVENOUS
  Filled 2017-04-01: qty 2

## 2017-04-01 MED ORDER — FLUTICASONE PROPIONATE HFA 110 MCG/ACT IN AERO: 1.0000 | INHALATION_SPRAY | Freq: Two times a day (BID) | RESPIRATORY_TRACT | 0 refills | Status: AC

## 2017-04-01 NOTE — ED Notes (Signed)
Pt found sleeping in waiting room

## 2017-04-01 NOTE — ED Notes (Signed)
Pt called in waiting room and outside. No answer. Pt moved back to waiting

## 2017-04-01 NOTE — ED Provider Notes (Signed)
[facility]   CSN: [id number] Arrival date & time: 03/31/17  2107     History   Chief Complaint Chief Complaint  Patient presents with  . Asthma    HPI Shirley Mckinney is a 36 y.o. female.  The history is provided by the patient.  She has a history of asthma.  For the last 2 days, she has had cough productive of thick white sputum and had wheezing and tightness in her chest.  There is also some midsternal pain which is worse when she takes a deep breath.  She denies fever or sweats but has had some cold chills.  There has been no nausea or vomiting.  She denies any sick contacts.  She normally smokes about 6 cigarettes a day, but states she has not been able to smoke since she got sick.   Past Medical History:  Diagnosis Date  . Asthma   . Gastritis     There are no active problems to display for this patient.   Past Surgical History:  Procedure Laterality Date  . APPENDECTOMY    . CESAREAN SECTION      OB History    No data available       Home Medications    Prior to Admission medications   Medication Sig Start Date End Date Taking? Authorizing Provider  albuterol (PROVENTIL HFA;VENTOLIN HFA) 108 (90 Base) MCG/ACT inhaler Inhale 1-2 puffs into the lungs every 6 (six) hours as needed for wheezing or shortness of breath. 11/25/16   Long, Arlyss RepressJoshua G, MD  albuterol (PROVENTIL) (2.5 MG/3ML) 0.083% nebulizer solution Take 3 mLs (2.5 mg total) by nebulization every 6 (six) hours as needed for wheezing or shortness of breath. 11/25/16   Long, Arlyss RepressJoshua G, MD  benzonatate (TESSALON) 100 MG capsule Take 1 capsule (100 mg total) every 8 (eight) hours by mouth. 02/16/17   Janne NapoleonNeese, Hope M, NP  cefUROXime (CEFTIN) 500 MG tablet Take 1 tablet (500 mg total) 2 (two) times daily with a meal by mouth. Patient not taking: Reported on 03/11/2017 02/18/17   Dione BoozeGlick, Kairi Harshbarger, MD  famotidine (PEPCID) 20 MG tablet Take 1 tablet (20 mg total) by  mouth 3 (three) times daily. Patient not taking: Reported on 03/11/2017 01/25/17   Demetrios LollLeaphart, Kenneth T, PA-C  HYDROcodone-acetaminophen (NORCO/VICODIN) 5-325 MG tablet Take 1 tablet by mouth every 6 (six) hours as needed for moderate pain. 03/11/17   Bethann BerkshireZammit, Joseph, MD  pantoprazole (PROTONIX) 40 MG tablet Take 1 tablet (40 mg total) daily by mouth. 02/18/17   Dione BoozeGlick, Jakhari Space, MD  predniSONE (DELTASONE) 50 MG tablet Starting tomorrow 02/17/17 take one tablet PO daily Patient not taking: Reported on 03/11/2017 02/16/17   Janne NapoleonNeese, Hope M, NP  sucralfate (CARAFATE) 1 g tablet Take 1 tablet (1 g total) 4 (four) times daily -  with meals and at bedtime by mouth. Patient not taking: Reported on 03/11/2017 02/18/17   Dione BoozeGlick, Kimbrely Buckel, MD    Family History History reviewed. No pertinent family history.  Social History Social History   Tobacco Use  . Smoking status: Current Every Day Smoker    Packs/day: 0.50    Types: Cigarettes  . Smokeless tobacco: Never Used  Substance Use Topics  . Alcohol use: No  . Drug use: No     Allergies   Ibuprofen; Penicillins; Tramadol; and Asa [aspirin]   Review of Systems Review of Systems  All other systems reviewed and are negative.    Physical Exam Updated  Vital Signs BP 105/77 (BP Location: Right Arm)   Pulse (!) 103   Temp 98.5 F (36.9 C) (Oral)   Resp 18   Ht 5\' 2"  (1.575 m)   Wt 79.4 kg (175 lb)   LMP 03/30/2017 (Exact Date)   SpO2 97%   BMI 32.01 kg/m   Physical Exam  Nursing note and vitals reviewed.  36 year old female, resting comfortably and in no acute distress. Vital signs are significant for tachycardia. Oxygen saturation is 97%, which is normal. Head is normocephalic and atraumatic. PERRLA, EOMI. Oropharynx is clear. Neck is nontender and supple without adenopathy or JVD. Back is nontender and there is no CVA tenderness. Lungs have diffuse inspiratory and expiratory wheezes without rales or rhonchi. Chest is nontender. Heart has  regular rate and rhythm without murmur. Abdomen is soft, flat, nontender without masses or hepatosplenomegaly and peristalsis is normoactive. Extremities have no cyanosis or edema, full range of motion is present. Skin is warm and dry without rash. Neurologic: Mental status is normal, cranial nerves are intact, there are no motor or sensory deficits.  ED Treatments / Results  Labs (all labs ordered are listed, but only abnormal results are displayed) Labs Reviewed  POC URINE PREG, ED    Radiology Dg Chest 2 View  Result Date: 03/31/2017 CLINICAL DATA:  Chest pain and dizziness.  Shortness of breath. EXAM: CHEST  2 VIEW COMPARISON:  February 16, 2017 FINDINGS: There is airspace consolidation in the right middle lobe consistent with pneumonia. Lungs elsewhere are clear. Heart size and pulmonary vascularity are normal. No adenopathy. No bone lesions. IMPRESSION: Right middle lobe consolidation consistent with pneumonia. Lungs elsewhere clear. Electronically Signed   By: Bretta BangWilliam  Woodruff III M.D.   On: 03/31/2017 22:16    Procedures Procedures (including critical care time)  Medications Ordered in ED Medications  levofloxacin (LEVAQUIN) IVPB 500 mg (500 mg Intravenous New Bag/Given 04/01/17 0529)  albuterol (PROVENTIL) (2.5 MG/3ML) 0.083% nebulizer solution 5 mg (5 mg Nebulization Given 03/31/17 2120)  ipratropium-albuterol (DUONEB) 0.5-2.5 (3) MG/3ML nebulizer solution 3 mL (3 mLs Nebulization Given 04/01/17 0434)  methylPREDNISolone sodium succinate (SOLU-MEDROL) 125 mg/2 mL injection 125 mg (125 mg Intravenous Given 04/01/17 0442)  acetaminophen (TYLENOL) tablet 650 mg (650 mg Oral Given 04/01/17 0529)  ipratropium-albuterol (DUONEB) 0.5-2.5 (3) MG/3ML nebulizer solution 3 mL (3 mLs Nebulization Given 04/01/17 0529)  ipratropium-albuterol (DUONEB) 0.5-2.5 (3) MG/3ML nebulizer solution 3 mL (3 mLs Nebulization Given 04/01/17 0555)     Initial Impression / Assessment and Plan / ED  Course  I have reviewed the triage vital signs and the nursing notes.  Pertinent labs & imaging results that were available during my care of the patient were reviewed by me and considered in my medical decision making (see chart for details).  Acute exacerbation of asthma.  Old records are reviewed, and she does have a prior ED visit for asthma exacerbation.  Patient states she has not been admitted for asthma since she was a child.  Of note, chest x-ray did show evidence of pneumonia.  Patient had received albuterol nebulizer treatment in triage, but apparently was not answering calls to come back following that and was assumed to have left.  She actually had fallen asleep in the triage area.  This led to a significant delay in getting antibiotics started.  As soon as I saw the patient and the x-ray report, antibiotics were ordered.  She will be given methylprednisolone and nebulizer treatment with albuterol and ipratropium.  5:06 AM She says she is feeling about the same, but on exam, there was decreased wheezing and some improvement in airflow.  Albuterol with ipratropium will be repeated.  5:43 AM Marked improvement after second nebulizer treatment with albuterol and ipratropium.  Minimal residual wheezing present.  She will be given a third nebulizer treatment and discharged.  She will be discharged with prescriptions for cefuroxime, albuterol inhaler, albuterol nebulizer solution, prednisone.  She also states that she needs a new prescription for her steroid inhaler which she needs.  Final Clinical Impressions(s) / ED Diagnoses   Final diagnoses:  Moderate persistent asthma with exacerbation  Pneumonia of right middle lobe due to infectious organism [facility])    ED Discharge Orders        Ordered    albuterol (PROVENTIL HFA;VENTOLIN HFA) 108 (90 Base) MCG/ACT inhaler  Every 4 hours PRN     04/01/17 0551    albuterol (PROVENTIL) (2.5 MG/3ML) 0.083% nebulizer solution  Every 6 hours PRN      04/01/17 0551    cefUROXime (CEFTIN) 500 MG tablet  2 times daily with meals     04/01/17 0551    predniSONE (DELTASONE) 50 MG tablet     04/01/17 0551    fluticasone (FLOVENT HFA) 110 MCG/ACT inhaler  2 times daily     04/01/17 0551       Dione Booze, MD 04/01/17 [phone]

## 2017-04-01 NOTE — Discharge Instructions (Signed)
Return if symptoms are getting worse. °

## 2019-06-01 IMAGING — CR DG CHEST 2V
2 series · 2 of 2 positions shown · non-contrast
Comparison: 01/20/2017

CLINICAL DATA: Shortness of breath, cough and fever for 3 days.

EXAM:
CHEST  2 VIEW

[w chest pa]
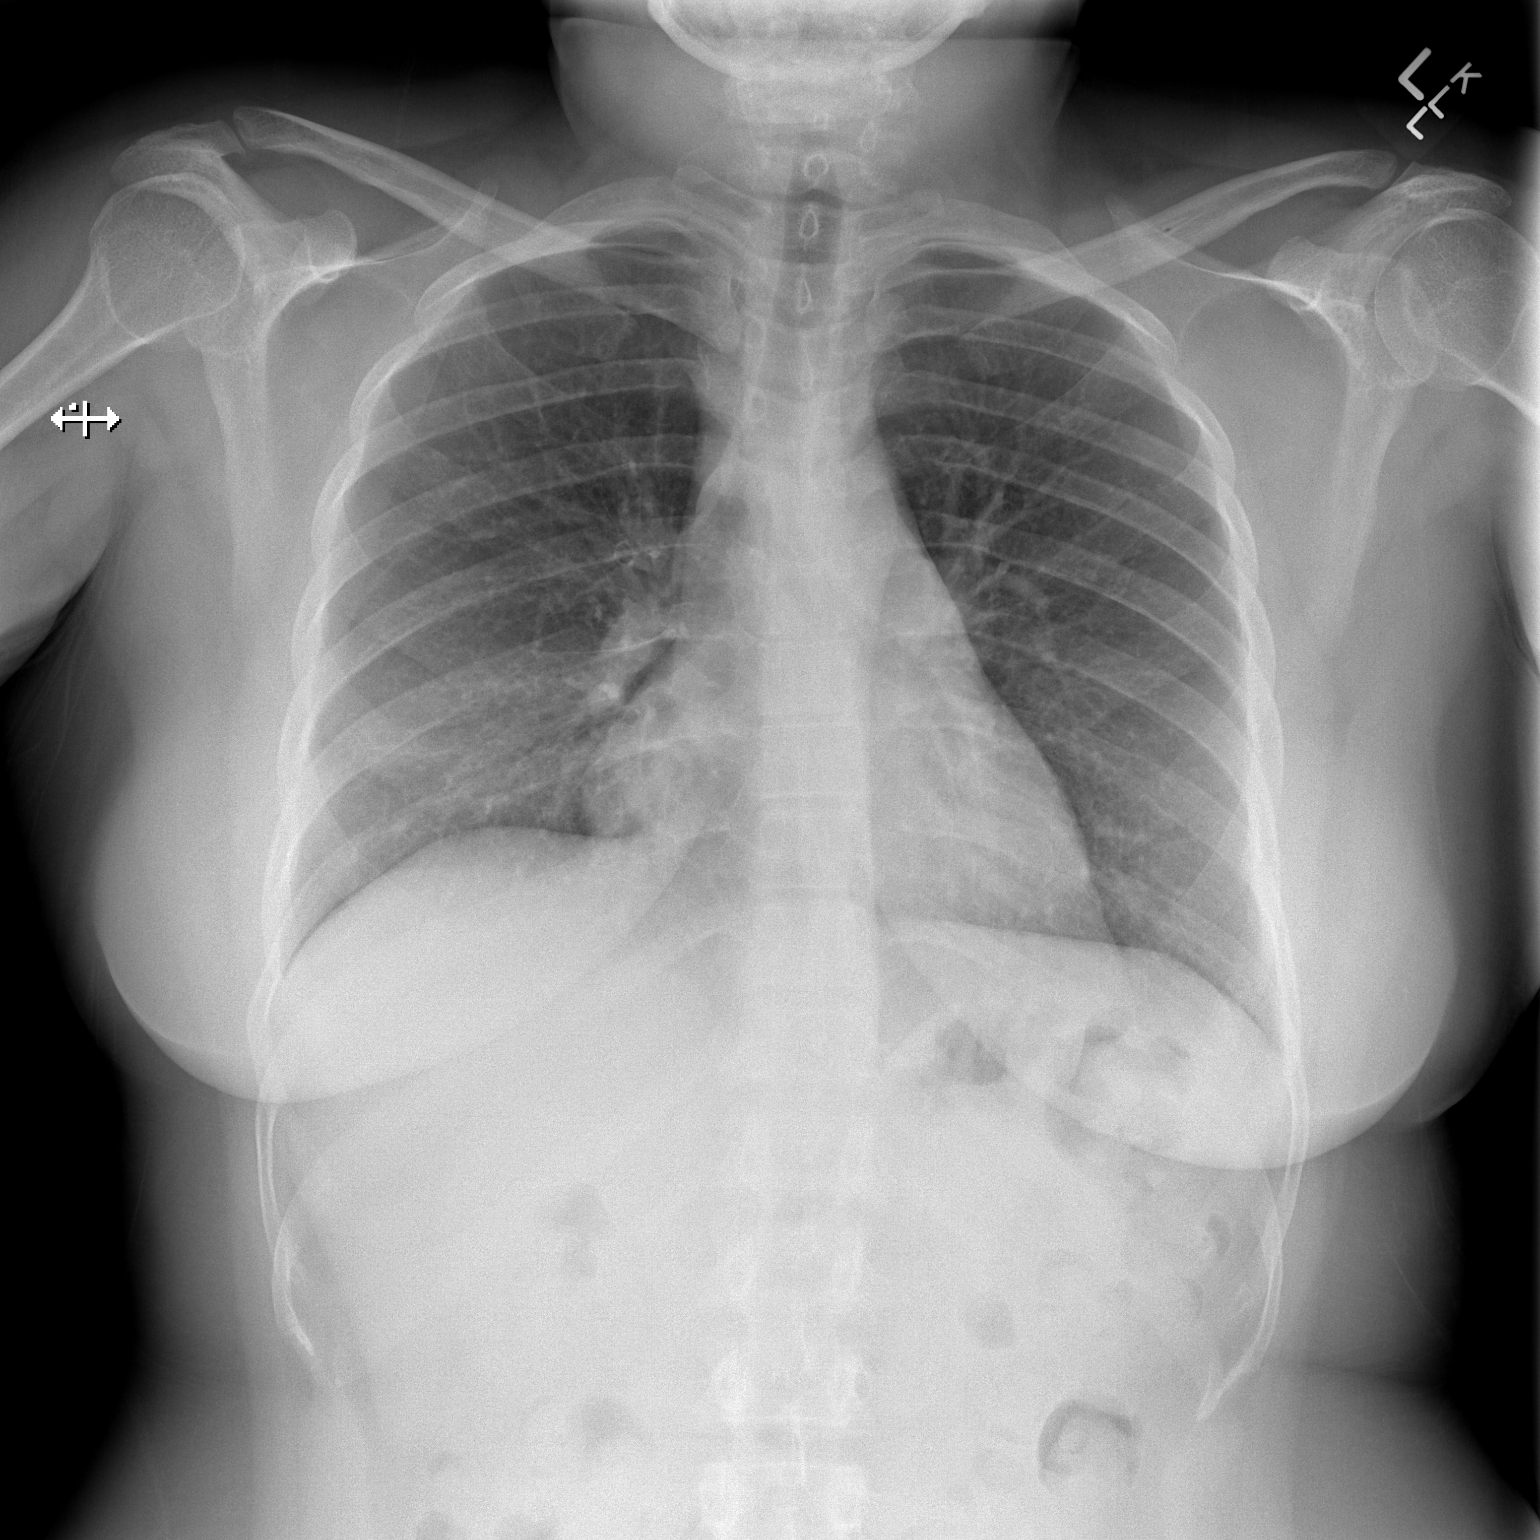

[w chest lat]
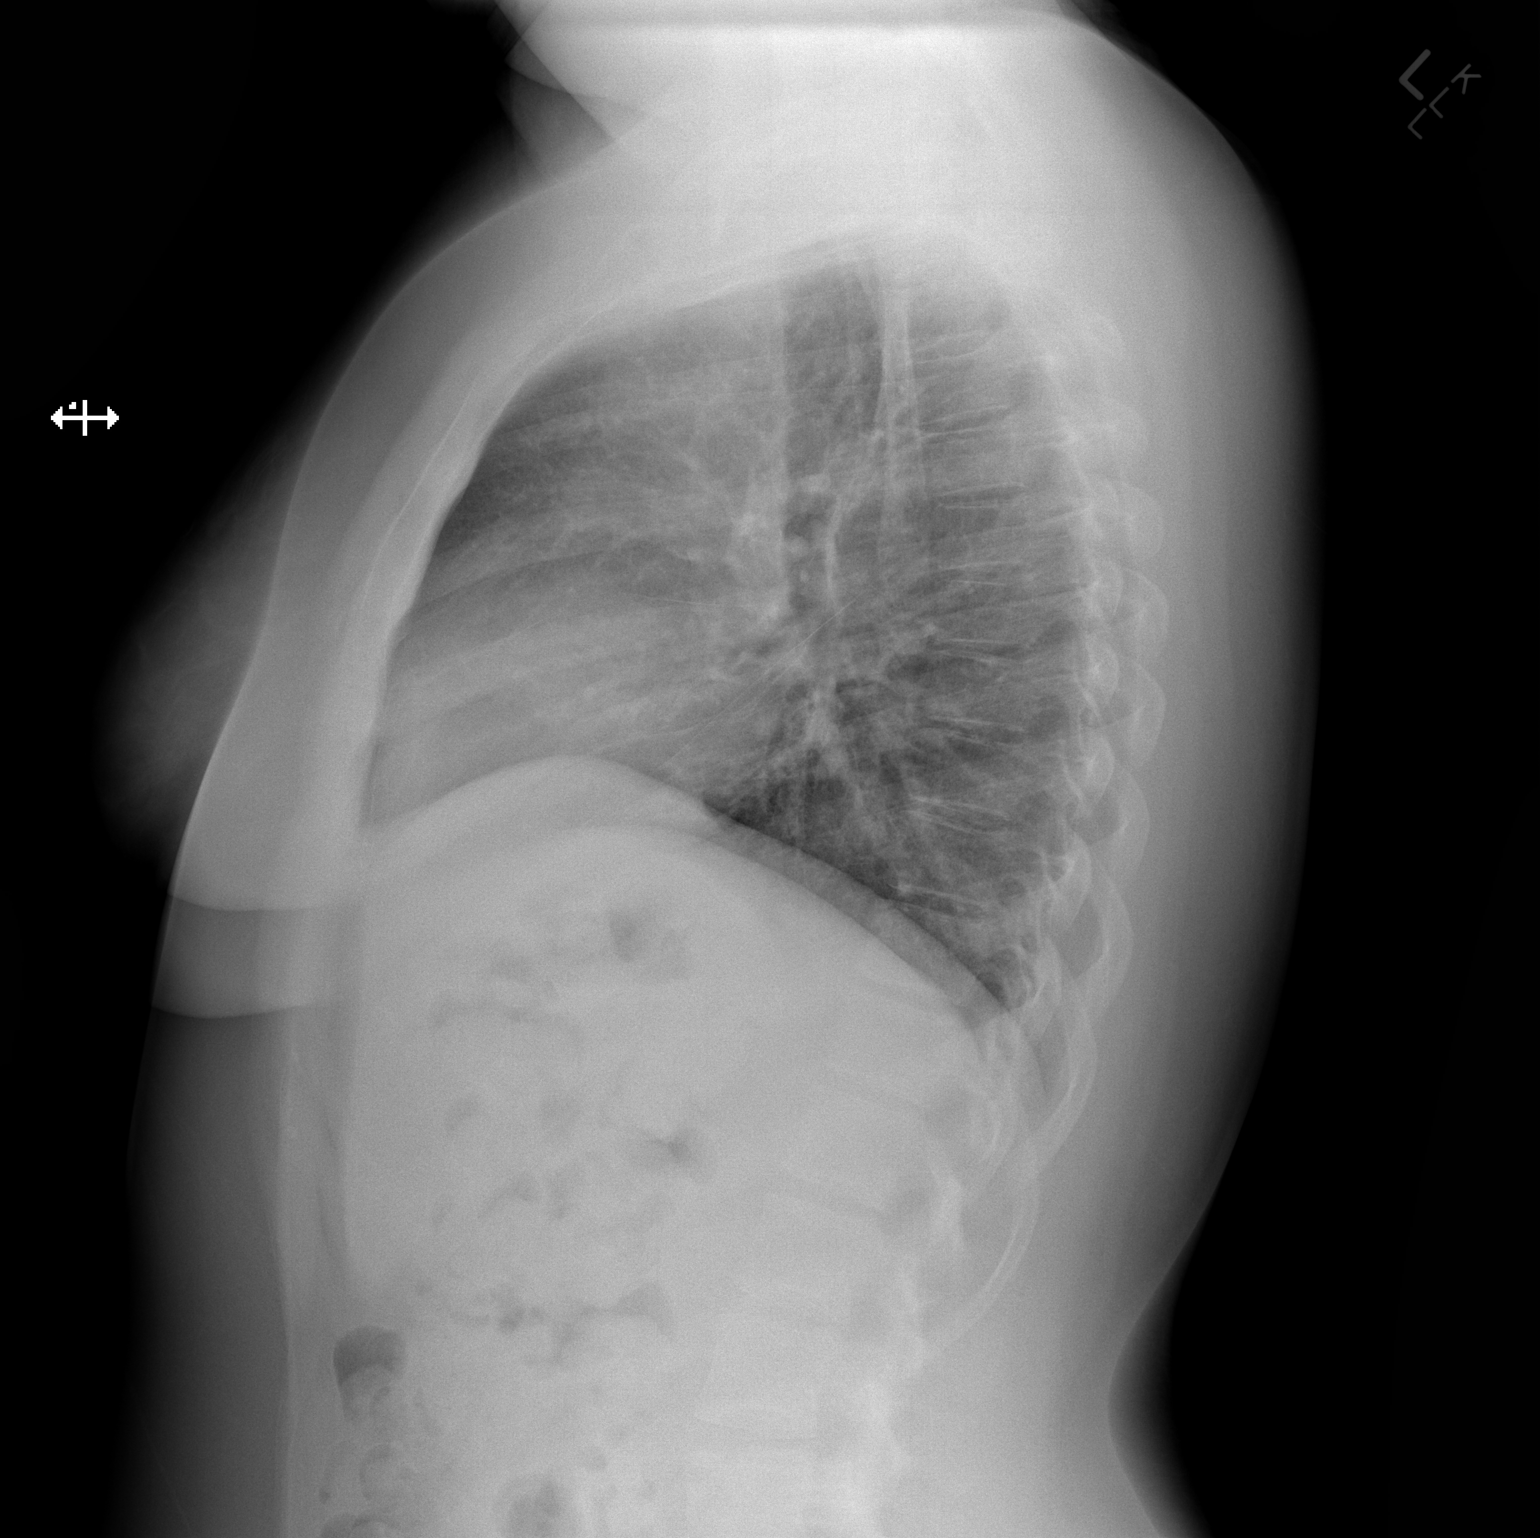

[2 of 2 positions shown; findings below may reference images not displayed]

FINDINGS: New right lower lung airspace disease is compatible with pneumonia.

The cardiomediastinal silhouette is unremarkable.

The left lung is clear.

No pleural effusion or pneumothorax.
IMPRESSION: Right lower lung pneumonia.
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
